# Patient Record
Sex: Male | Born: 1955 | Race: White | Hispanic: No | Marital: Married
Health system: Southern US, Community
[De-identification: ages and names within clinical notes are randomized; demographics above are authoritative.]

---

## 2004-01-27 ENCOUNTER — Other Ambulatory Visit: Payer: Self-pay

## 2005-06-24 ENCOUNTER — Ambulatory Visit: Payer: Self-pay | Admitting: Unknown Physician Specialty

## 2006-01-02 ENCOUNTER — Inpatient Hospital Stay: Payer: Self-pay | Admitting: General Surgery

## 2006-03-07 ENCOUNTER — Ambulatory Visit: Payer: Self-pay | Admitting: Gastroenterology

## 2006-03-19 ENCOUNTER — Other Ambulatory Visit: Payer: Self-pay

## 2006-03-21 ENCOUNTER — Inpatient Hospital Stay: Payer: Self-pay | Admitting: General Surgery

## 2006-11-26 ENCOUNTER — Ambulatory Visit: Payer: Self-pay | Admitting: Unknown Physician Specialty

## 2007-06-04 ENCOUNTER — Ambulatory Visit: Payer: Self-pay | Admitting: Unknown Physician Specialty

## 2007-11-03 ENCOUNTER — Ambulatory Visit: Payer: Self-pay | Admitting: Unknown Physician Specialty

## 2007-11-25 ENCOUNTER — Ambulatory Visit: Payer: Self-pay

## 2007-12-10 ENCOUNTER — Ambulatory Visit: Payer: Self-pay

## 2008-04-15 ENCOUNTER — Ambulatory Visit: Payer: Self-pay | Admitting: Unknown Physician Specialty

## 2008-05-26 ENCOUNTER — Ambulatory Visit: Payer: Self-pay | Admitting: General Surgery

## 2008-05-26 ENCOUNTER — Other Ambulatory Visit: Payer: Self-pay

## 2008-06-02 ENCOUNTER — Ambulatory Visit: Payer: Self-pay | Admitting: General Surgery

## 2013-02-18 ENCOUNTER — Ambulatory Visit: Payer: Self-pay | Admitting: Surgery

## 2013-02-18 DIAGNOSIS — I10 Essential (primary) hypertension: Secondary | ICD-10-CM

## 2013-02-18 LAB — CBC WITH DIFFERENTIAL/PLATELET
Eosinophil #: 0.4 10*3/uL (ref 0.0–0.7)
HCT: 34.6 % — ABNORMAL LOW (ref 40.0–52.0)
Lymphocyte %: 17.5 %
MCH: 34.2 pg — ABNORMAL HIGH (ref 26.0–34.0)
MCHC: 34.7 g/dL (ref 32.0–36.0)
MCV: 99 fL (ref 80–100)
Monocyte #: 0.5 x10 3/mm (ref 0.2–1.0)
Monocyte %: 8.1 %
Neutrophil %: 68 %
Platelet: 74 10*3/uL — ABNORMAL LOW (ref 150–440)
RBC: 3.51 10*6/uL — ABNORMAL LOW (ref 4.40–5.90)
RDW: 13.2 % (ref 11.5–14.5)
WBC: 6.6 10*3/uL (ref 3.8–10.6)

## 2013-02-18 LAB — BASIC METABOLIC PANEL
Anion Gap: 6 — ABNORMAL LOW (ref 7–16)
BUN: 6 mg/dL — ABNORMAL LOW (ref 7–18)
Chloride: 104 mmol/L (ref 98–107)
Creatinine: 1 mg/dL (ref 0.60–1.30)
EGFR (Non-African Amer.): >60
Osmolality: 273 (ref 275–301)
Potassium: 3.4 mmol/L — ABNORMAL LOW (ref 3.5–5.1)

## 2013-03-04 ENCOUNTER — Ambulatory Visit: Payer: Self-pay | Admitting: Surgery

## 2013-03-05 LAB — CBC WITH DIFFERENTIAL/PLATELET
Basophil #: 0 10*3/uL (ref 0.0–0.1)
Basophil %: 0.2 %
Eosinophil %: 0.1 %
HCT: 31.3 % — ABNORMAL LOW (ref 40.0–52.0)
HGB: 11.1 g/dL — ABNORMAL LOW (ref 13.0–18.0)
MCH: 34.9 pg — ABNORMAL HIGH (ref 26.0–34.0)
Monocyte %: 4.8 %
Neutrophil #: 6.5 10*3/uL (ref 1.4–6.5)
Neutrophil %: 85.7 %
Platelet: 54 10*3/uL — ABNORMAL LOW (ref 150–440)
WBC: 7.6 10*3/uL (ref 3.8–10.6)

## 2013-03-05 LAB — HEPATIC FUNCTION PANEL A (ARMC)
Alkaline Phosphatase: 100 U/L (ref 50–136)
Bilirubin, Direct: 2.1 mg/dL — ABNORMAL HIGH (ref 0.00–0.20)
Bilirubin,Total: 4.2 mg/dL — ABNORMAL HIGH (ref 0.2–1.0)
SGOT(AST): 45 U/L — ABNORMAL HIGH (ref 15–37)
SGPT (ALT): 28 U/L (ref 12–78)
Total Protein: 6.2 g/dL — ABNORMAL LOW (ref 6.4–8.2)

## 2013-03-05 LAB — BASIC METABOLIC PANEL
BUN: 23 mg/dL — ABNORMAL HIGH (ref 7–18)
Chloride: 105 mmol/L (ref 98–107)
EGFR (Non-African Amer.): 52 — ABNORMAL LOW
Glucose: 127 mg/dL — ABNORMAL HIGH (ref 65–99)
Osmolality: 279 (ref 275–301)

## 2013-03-10 ENCOUNTER — Emergency Department: Payer: Self-pay

## 2013-03-10 LAB — COMPREHENSIVE METABOLIC PANEL
Albumin: 2.5 g/dL — ABNORMAL LOW (ref 3.4–5.0)
Alkaline Phosphatase: 100 U/L (ref 50–136)
Anion Gap: 6 — ABNORMAL LOW (ref 7–16)
BUN: 15 mg/dL (ref 7–18)
Calcium, Total: 8.7 mg/dL (ref 8.5–10.1)
Chloride: 108 mmol/L — ABNORMAL HIGH (ref 98–107)
Creatinine: 1.05 mg/dL (ref 0.60–1.30)
EGFR (African American): >60
Osmolality: 285 (ref 275–301)
Potassium: 3.2 mmol/L — ABNORMAL LOW (ref 3.5–5.1)
SGOT(AST): 50 U/L — ABNORMAL HIGH (ref 15–37)
SGPT (ALT): 29 U/L (ref 12–78)
Sodium: 142 mmol/L (ref 136–145)
Total Protein: 5.9 g/dL — ABNORMAL LOW (ref 6.4–8.2)

## 2013-03-10 LAB — CBC
HCT: 30.5 % — ABNORMAL LOW (ref 40.0–52.0)
HGB: 10.4 g/dL — ABNORMAL LOW (ref 13.0–18.0)
MCH: 33.5 pg (ref 26.0–34.0)
MCV: 99 fL (ref 80–100)
Platelet: 58 10*3/uL — ABNORMAL LOW (ref 150–440)
RBC: 3.09 10*6/uL — ABNORMAL LOW (ref 4.40–5.90)

## 2013-03-10 LAB — URINALYSIS, COMPLETE
Leukocyte Esterase: NEGATIVE
Nitrite: NEGATIVE
Ph: 6 (ref 4.5–8.0)
Protein: NEGATIVE
Specific Gravity: 1.023 (ref 1.003–1.030)
Squamous Epithelial: 1
WBC UR: 2 /HPF (ref 0–5)

## 2013-03-10 LAB — TROPONIN I: Troponin-I: <0.02 ng/mL

## 2013-03-16 ENCOUNTER — Ambulatory Visit: Payer: Self-pay | Admitting: Surgery

## 2013-03-22 ENCOUNTER — Emergency Department: Payer: Self-pay | Admitting: Emergency Medicine

## 2013-03-22 LAB — BASIC METABOLIC PANEL
Anion Gap: 11 (ref 7–16)
BUN: 13 mg/dL (ref 7–18)
Calcium, Total: 8.9 mg/dL (ref 8.5–10.1)
EGFR (Non-African Amer.): >60
Glucose: 146 mg/dL — ABNORMAL HIGH (ref 65–99)
Potassium: 3.2 mmol/L — ABNORMAL LOW (ref 3.5–5.1)
Sodium: 142 mmol/L (ref 136–145)

## 2013-03-22 LAB — CBC
HCT: 30 % — ABNORMAL LOW (ref 40.0–52.0)
MCH: 34.7 pg — ABNORMAL HIGH (ref 26.0–34.0)
MCHC: 35.5 g/dL (ref 32.0–36.0)
MCV: 98 fL (ref 80–100)
RBC: 3.07 10*6/uL — ABNORMAL LOW (ref 4.40–5.90)
RDW: 14.6 % — ABNORMAL HIGH (ref 11.5–14.5)
WBC: 10.4 10*3/uL (ref 3.8–10.6)

## 2013-03-30 ENCOUNTER — Ambulatory Visit: Payer: Self-pay | Admitting: Family Medicine

## 2013-04-30 ENCOUNTER — Inpatient Hospital Stay: Payer: Self-pay | Admitting: Internal Medicine

## 2013-04-30 LAB — COMPREHENSIVE METABOLIC PANEL
Albumin: 2.3 g/dL — ABNORMAL LOW (ref 3.4–5.0)
Alkaline Phosphatase: 126 U/L (ref 50–136)
Anion Gap: 7 (ref 7–16)
BUN: 13 mg/dL (ref 7–18)
Bilirubin,Total: 3.5 mg/dL — ABNORMAL HIGH (ref 0.2–1.0)
Calcium, Total: 8.5 mg/dL (ref 8.5–10.1)
Chloride: 111 mmol/L — ABNORMAL HIGH (ref 98–107)
Co2: 25 mmol/L (ref 21–32)
Creatinine: 0.98 mg/dL (ref 0.60–1.30)
EGFR (Non-African Amer.): >60
Glucose: 121 mg/dL — ABNORMAL HIGH (ref 65–99)
Osmolality: 286 (ref 275–301)
Potassium: 3.3 mmol/L — ABNORMAL LOW (ref 3.5–5.1)
SGOT(AST): 36 U/L (ref 15–37)
SGPT (ALT): 22 U/L (ref 12–78)
Sodium: 143 mmol/L (ref 136–145)
Total Protein: 5.7 g/dL — ABNORMAL LOW (ref 6.4–8.2)

## 2013-04-30 LAB — PROTIME-INR
INR: 1.6
Prothrombin Time: 19 secs — ABNORMAL HIGH (ref 11.5–14.7)

## 2013-04-30 LAB — CBC
HGB: 9.4 g/dL — ABNORMAL LOW (ref 13.0–18.0)
MCHC: 34.4 g/dL (ref 32.0–36.0)
Platelet: 77 10*3/uL — ABNORMAL LOW (ref 150–440)
WBC: 4.8 10*3/uL (ref 3.8–10.6)

## 2013-04-30 LAB — AMMONIA: Ammonia, Plasma: 122 mcmol/L — ABNORMAL HIGH (ref 11–32)

## 2013-05-01 LAB — URINALYSIS, COMPLETE
Bacteria: NONE SEEN
Blood: NEGATIVE
Glucose,UR: NEGATIVE mg/dL (ref 0–75)
Hyaline Cast: 7
Leukocyte Esterase: NEGATIVE
Nitrite: NEGATIVE
Protein: NEGATIVE
Specific Gravity: 1.013 (ref 1.003–1.030)
Squamous Epithelial: NONE SEEN
WBC UR: 1 /HPF (ref 0–5)

## 2013-05-01 LAB — CBC WITH DIFFERENTIAL/PLATELET
Basophil #: 0 10*3/uL (ref 0.0–0.1)
Basophil %: 1 %
Eosinophil #: 0.2 10*3/uL (ref 0.0–0.7)
Eosinophil %: 4.2 %
Lymphocyte #: 1 10*3/uL (ref 1.0–3.6)
Lymphocyte %: 23.9 %
MCHC: 35.6 g/dL (ref 32.0–36.0)
MCV: 100 fL (ref 80–100)
Monocyte %: 10 %
RBC: 2.58 10*6/uL — ABNORMAL LOW (ref 4.40–5.90)
WBC: 4 10*3/uL (ref 3.8–10.6)

## 2013-05-01 LAB — BASIC METABOLIC PANEL
BUN: 12 mg/dL (ref 7–18)
Chloride: 110 mmol/L — ABNORMAL HIGH (ref 98–107)
Co2: 26 mmol/L (ref 21–32)
EGFR (African American): >60
EGFR (Non-African Amer.): >60
Glucose: 91 mg/dL (ref 65–99)
Osmolality: 282 (ref 275–301)
Sodium: 142 mmol/L (ref 136–145)

## 2013-05-02 LAB — BASIC METABOLIC PANEL
Calcium, Total: 8.7 mg/dL (ref 8.5–10.1)
Chloride: 108 mmol/L — ABNORMAL HIGH (ref 98–107)
EGFR (African American): >60
Glucose: 143 mg/dL — ABNORMAL HIGH (ref 65–99)
Osmolality: 280 (ref 275–301)
Sodium: 139 mmol/L (ref 136–145)

## 2013-06-24 ENCOUNTER — Inpatient Hospital Stay: Payer: Self-pay | Admitting: Internal Medicine

## 2013-06-24 LAB — CBC
HCT: 33.7 % — ABNORMAL LOW (ref 40.0–52.0)
MCH: 32.8 pg (ref 26.0–34.0)
MCV: 95 fL (ref 80–100)
Platelet: 130 10*3/uL — ABNORMAL LOW (ref 150–440)
RBC: 3.56 10*6/uL — ABNORMAL LOW (ref 4.40–5.90)
RDW: 14 % (ref 11.5–14.5)
WBC: 8.3 10*3/uL (ref 3.8–10.6)

## 2013-06-24 LAB — COMPREHENSIVE METABOLIC PANEL
Albumin: 2.9 g/dL — ABNORMAL LOW (ref 3.4–5.0)
Alkaline Phosphatase: 158 U/L — ABNORMAL HIGH (ref 50–136)
Bilirubin,Total: 4.9 mg/dL — ABNORMAL HIGH (ref 0.2–1.0)
Potassium: 4.2 mmol/L (ref 3.5–5.1)
SGOT(AST): 43 U/L — ABNORMAL HIGH (ref 15–37)
Total Protein: 7.6 g/dL (ref 6.4–8.2)

## 2013-06-24 LAB — URINALYSIS, COMPLETE
Bacteria: NONE SEEN
Bilirubin,UR: NEGATIVE
Hyaline Cast: 1
Ketone: NEGATIVE
Leukocyte Esterase: NEGATIVE
Ph: 6 (ref 4.5–8.0)
Protein: NEGATIVE
RBC,UR: 142 /HPF (ref 0–5)
Squamous Epithelial: NONE SEEN
WBC UR: 2 /HPF (ref 0–5)

## 2013-06-24 LAB — PROTIME-INR
INR: 1.4
Prothrombin Time: 17.2 secs — ABNORMAL HIGH (ref 11.5–14.7)

## 2013-06-24 LAB — APTT: Activated PTT: 39.2 secs — ABNORMAL HIGH (ref 23.6–35.9)

## 2013-06-24 LAB — AMMONIA
Ammonia, Plasma: 63 mcmol/L — ABNORMAL HIGH (ref 11–32)
Ammonia, Plasma: 64 mcmol/L — ABNORMAL HIGH (ref 11–32)

## 2013-06-24 LAB — TROPONIN I: Troponin-I: <0.02 ng/mL

## 2013-06-25 LAB — CBC WITH DIFFERENTIAL/PLATELET
Basophil %: 1.3 %
Eosinophil %: 5.5 %
HGB: 11.1 g/dL — ABNORMAL LOW (ref 13.0–18.0)
Lymphocyte %: 28.4 %
MCH: 33.2 pg (ref 26.0–34.0)
MCV: 94 fL (ref 80–100)
Monocyte #: 0.7 x10 3/mm (ref 0.2–1.0)
Monocyte %: 9.8 %
Platelet: 113 10*3/uL — ABNORMAL LOW (ref 150–440)
RDW: 14 % (ref 11.5–14.5)

## 2013-06-25 LAB — MAGNESIUM: Magnesium: 1.5 mg/dL — ABNORMAL LOW

## 2013-06-25 LAB — COMPREHENSIVE METABOLIC PANEL
Albumin: 2.8 g/dL — ABNORMAL LOW (ref 3.4–5.0)
Anion Gap: 7 (ref 7–16)
Bilirubin,Total: 5.1 mg/dL — ABNORMAL HIGH (ref 0.2–1.0)
Calcium, Total: 9.2 mg/dL (ref 8.5–10.1)
Chloride: 106 mmol/L (ref 98–107)
Creatinine: 1 mg/dL (ref 0.60–1.30)
EGFR (African American): >60
EGFR (Non-African Amer.): >60
Osmolality: 274 (ref 275–301)
SGOT(AST): 42 U/L — ABNORMAL HIGH (ref 15–37)
SGPT (ALT): 22 U/L (ref 12–78)
Sodium: 136 mmol/L (ref 136–145)
Total Protein: 6.6 g/dL (ref 6.4–8.2)

## 2013-06-25 LAB — PROTIME-INR: Prothrombin Time: 18.2 secs — ABNORMAL HIGH (ref 11.5–14.7)

## 2013-06-25 LAB — TSH: Thyroid Stimulating Horm: 0.693 u[IU]/mL

## 2013-06-25 LAB — SEDIMENTATION RATE: Erythrocyte Sed Rate: 43 mm/hr — ABNORMAL HIGH (ref 0–20)

## 2013-06-26 LAB — CBC WITH DIFFERENTIAL/PLATELET
Basophil %: 0.9 %
Eosinophil %: 6.2 %
HGB: 10.2 g/dL — ABNORMAL LOW (ref 13.0–18.0)
Lymphocyte #: 1.6 10*3/uL (ref 1.0–3.6)
MCH: 32.5 pg (ref 26.0–34.0)
MCHC: 35 g/dL (ref 32.0–36.0)
MCV: 93 fL (ref 80–100)
Monocyte #: 0.7 x10 3/mm (ref 0.2–1.0)
Neutrophil #: 5 10*3/uL (ref 1.4–6.5)
Neutrophil %: 63.5 %
Platelet: 110 10*3/uL — ABNORMAL LOW (ref 150–440)
RBC: 3.14 10*6/uL — ABNORMAL LOW (ref 4.40–5.90)
WBC: 7.9 10*3/uL (ref 3.8–10.6)

## 2013-06-26 LAB — COMPREHENSIVE METABOLIC PANEL
Albumin: 2.6 g/dL — ABNORMAL LOW (ref 3.4–5.0)
BUN: 16 mg/dL (ref 7–18)
Bilirubin,Total: 4.7 mg/dL — ABNORMAL HIGH (ref 0.2–1.0)
Calcium, Total: 9.1 mg/dL (ref 8.5–10.1)
Co2: 24 mmol/L (ref 21–32)
EGFR (African American): >60
Glucose: 101 mg/dL — ABNORMAL HIGH (ref 65–99)
Osmolality: 271 (ref 275–301)
Potassium: 4.5 mmol/L (ref 3.5–5.1)
SGOT(AST): 42 U/L — ABNORMAL HIGH (ref 15–37)
SGPT (ALT): 24 U/L (ref 12–78)
Sodium: 135 mmol/L — ABNORMAL LOW (ref 136–145)
Total Protein: 6.8 g/dL (ref 6.4–8.2)

## 2013-06-26 LAB — URINE CULTURE

## 2013-06-28 LAB — COMPREHENSIVE METABOLIC PANEL
Albumin: 2.6 g/dL — ABNORMAL LOW (ref 3.4–5.0)
Anion Gap: 7 (ref 7–16)
BUN: 15 mg/dL (ref 7–18)
Bilirubin,Total: 4.5 mg/dL — ABNORMAL HIGH (ref 0.2–1.0)
Calcium, Total: 9.2 mg/dL (ref 8.5–10.1)
Chloride: 105 mmol/L (ref 98–107)
Co2: 21 mmol/L (ref 21–32)
Creatinine: 1 mg/dL (ref 0.60–1.30)
EGFR (Non-African Amer.): >60
Glucose: 122 mg/dL — ABNORMAL HIGH (ref 65–99)
Osmolality: 269 (ref 275–301)
Potassium: 4 mmol/L (ref 3.5–5.1)
SGPT (ALT): 31 U/L (ref 12–78)

## 2013-06-28 LAB — AMMONIA: Ammonia, Plasma: 49 mcmol/L — ABNORMAL HIGH (ref 11–32)

## 2013-07-12 ENCOUNTER — Emergency Department: Payer: Self-pay | Admitting: Emergency Medicine

## 2013-07-12 LAB — COMPREHENSIVE METABOLIC PANEL
Albumin: 2.7 g/dL — ABNORMAL LOW (ref 3.4–5.0)
BUN: 13 mg/dL (ref 7–18)
Bilirubin,Total: 5.6 mg/dL — ABNORMAL HIGH (ref 0.2–1.0)
Calcium, Total: 8.7 mg/dL (ref 8.5–10.1)
EGFR (African American): >60
Glucose: 110 mg/dL — ABNORMAL HIGH (ref 65–99)
SGOT(AST): 45 U/L — ABNORMAL HIGH (ref 15–37)

## 2013-07-12 LAB — CBC
HCT: 30 % — ABNORMAL LOW (ref 40.0–52.0)
HGB: 10.7 g/dL — ABNORMAL LOW (ref 13.0–18.0)
MCH: 34.1 pg — ABNORMAL HIGH (ref 26.0–34.0)
MCHC: 35.8 g/dL (ref 32.0–36.0)
MCV: 95 fL (ref 80–100)
Platelet: 104 10*3/uL — ABNORMAL LOW (ref 150–440)
RBC: 3.15 10*6/uL — ABNORMAL LOW (ref 4.40–5.90)
RDW: 18.2 % — ABNORMAL HIGH (ref 11.5–14.5)
WBC: 10 10*3/uL (ref 3.8–10.6)

## 2013-07-12 LAB — URINALYSIS, COMPLETE
Bilirubin,UR: NEGATIVE
Blood: NEGATIVE
Glucose,UR: NEGATIVE mg/dL (ref 0–75)
Granular Cast: 2
Ketone: NEGATIVE
Leukocyte Esterase: NEGATIVE
Ph: 6 (ref 4.5–8.0)
Protein: NEGATIVE
RBC,UR: 1 /HPF (ref 0–5)
Specific Gravity: 1.013 (ref 1.003–1.030)
Squamous Epithelial: NONE SEEN

## 2013-07-23 LAB — COMPREHENSIVE METABOLIC PANEL
Albumin: 3.1 g/dL — ABNORMAL LOW (ref 3.4–5.0)
Anion Gap: 7 (ref 7–16)
BUN: 26 mg/dL — ABNORMAL HIGH (ref 7–18)
Bilirubin,Total: 3.7 mg/dL — ABNORMAL HIGH (ref 0.2–1.0)
EGFR (African American): >60
Glucose: 115 mg/dL — ABNORMAL HIGH (ref 65–99)
Osmolality: 270 (ref 275–301)
Sodium: 132 mmol/L — ABNORMAL LOW (ref 136–145)
Total Protein: 7.4 g/dL (ref 6.4–8.2)

## 2013-07-23 LAB — TROPONIN I: Troponin-I: <0.02 ng/mL

## 2013-07-23 LAB — URINALYSIS, COMPLETE
Bacteria: NONE SEEN
Bilirubin,UR: NEGATIVE
Blood: NEGATIVE
Glucose,UR: NEGATIVE mg/dL (ref 0–75)
Ketone: NEGATIVE
Nitrite: NEGATIVE
Ph: 6 (ref 4.5–8.0)
Specific Gravity: 1.026 (ref 1.003–1.030)
Squamous Epithelial: NONE SEEN

## 2013-07-23 LAB — CBC WITH DIFFERENTIAL/PLATELET
Basophil %: 0.7 %
Eosinophil #: 0.1 10*3/uL (ref 0.0–0.7)
Eosinophil %: 1.2 %
HCT: 34.1 % — ABNORMAL LOW (ref 40.0–52.0)
Lymphocyte #: 1.4 10*3/uL (ref 1.0–3.6)
Monocyte #: 0.4 x10 3/mm (ref 0.2–1.0)
Neutrophil #: 6.6 10*3/uL — ABNORMAL HIGH (ref 1.4–6.5)
Neutrophil %: 77.3 %
Platelet: 127 10*3/uL — ABNORMAL LOW (ref 150–440)
RBC: 3.54 10*6/uL — ABNORMAL LOW (ref 4.40–5.90)
RDW: 17.4 % — ABNORMAL HIGH (ref 11.5–14.5)
WBC: 8.5 10*3/uL (ref 3.8–10.6)

## 2013-07-23 LAB — LIPASE, BLOOD: Lipase: 458 U/L — ABNORMAL HIGH (ref 73–393)

## 2013-07-23 LAB — AMMONIA: Ammonia, Plasma: 69 mcmol/L — ABNORMAL HIGH (ref 11–32)

## 2013-07-24 ENCOUNTER — Observation Stay: Payer: Self-pay | Admitting: Student

## 2013-10-10 ENCOUNTER — Inpatient Hospital Stay: Payer: Self-pay | Admitting: Internal Medicine

## 2013-10-10 LAB — URINALYSIS, COMPLETE
Glucose,UR: NEGATIVE mg/dL (ref 0–75)
Nitrite: NEGATIVE
Protein: NEGATIVE
Specific Gravity: 1.011 (ref 1.003–1.030)
Squamous Epithelial: <1

## 2013-10-10 LAB — COMPREHENSIVE METABOLIC PANEL
Alkaline Phosphatase: 205 U/L — ABNORMAL HIGH (ref 50–136)
Anion Gap: 5 — ABNORMAL LOW (ref 7–16)
Bilirubin,Total: 3.3 mg/dL — ABNORMAL HIGH (ref 0.2–1.0)
Calcium, Total: 9.2 mg/dL (ref 8.5–10.1)
Chloride: 104 mmol/L (ref 98–107)
Co2: 28 mmol/L (ref 21–32)
EGFR (Non-African Amer.): 59 — ABNORMAL LOW
Osmolality: 277 (ref 275–301)
Potassium: 5.1 mmol/L (ref 3.5–5.1)
Sodium: 137 mmol/L (ref 136–145)

## 2013-10-10 LAB — CBC WITH DIFFERENTIAL/PLATELET
Basophil %: 0.2 %
Eosinophil #: 0.1 10*3/uL (ref 0.0–0.7)
Eosinophil %: 1.9 %
HGB: 11 g/dL — ABNORMAL LOW (ref 13.0–18.0)
Lymphocyte #: 1.5 10*3/uL (ref 1.0–3.6)
Lymphocyte %: 23.9 %
MCV: 93 fL (ref 80–100)
Monocyte #: 0.6 x10 3/mm (ref 0.2–1.0)
Monocyte %: 9.5 %
Neutrophil #: 4 10*3/uL (ref 1.4–6.5)
Platelet: 86 10*3/uL — ABNORMAL LOW (ref 150–440)
RDW: 14.8 % — ABNORMAL HIGH (ref 11.5–14.5)

## 2013-10-10 LAB — PROTIME-INR
INR: 1.7
Prothrombin Time: 19.6 secs — ABNORMAL HIGH (ref 11.5–14.7)

## 2013-10-10 LAB — DRUG SCREEN, URINE
Amphetamines, Ur Screen: NEGATIVE (ref ?–1000)
Barbiturates, Ur Screen: NEGATIVE (ref ?–200)
Benzodiazepine, Ur Scrn: NEGATIVE (ref ?–200)
Cocaine Metabolite,Ur ~~LOC~~: NEGATIVE (ref ?–300)
Methadone, Ur Screen: NEGATIVE (ref ?–300)
Opiate, Ur Screen: NEGATIVE (ref ?–300)

## 2013-10-10 LAB — AMMONIA: Ammonia, Plasma: 162 mcmol/L — ABNORMAL HIGH (ref 11–32)

## 2013-10-10 LAB — CK TOTAL AND CKMB (NOT AT ARMC)
CK, Total: 47 U/L (ref 35–232)
CK-MB: 0.9 ng/mL (ref 0.5–3.6)

## 2013-10-10 LAB — ETHANOL
Ethanol %: <0.003 % (ref 0.000–0.080)
Ethanol: <3 mg/dL

## 2013-10-10 LAB — TROPONIN I: Troponin-I: <0.02 ng/mL

## 2013-10-11 LAB — HEMOGLOBIN A1C: Hemoglobin A1C: 4.7 % (ref 4.2–6.3)

## 2013-10-11 LAB — COMPREHENSIVE METABOLIC PANEL
Anion Gap: 4 — ABNORMAL LOW (ref 7–16)
Bilirubin,Total: 4 mg/dL — ABNORMAL HIGH (ref 0.2–1.0)
Chloride: 109 mmol/L — ABNORMAL HIGH (ref 98–107)
Co2: 26 mmol/L (ref 21–32)
Creatinine: 1.23 mg/dL (ref 0.60–1.30)
EGFR (African American): >60
Glucose: 83 mg/dL (ref 65–99)
Osmolality: 279 (ref 275–301)
SGOT(AST): 56 U/L — ABNORMAL HIGH (ref 15–37)
SGPT (ALT): 40 U/L (ref 12–78)
Sodium: 139 mmol/L (ref 136–145)
Total Protein: 5.2 g/dL — ABNORMAL LOW (ref 6.4–8.2)

## 2013-10-11 LAB — CBC WITH DIFFERENTIAL/PLATELET
Basophil #: 0 10*3/uL (ref 0.0–0.1)
Eosinophil #: 0.1 10*3/uL (ref 0.0–0.7)
Eosinophil %: 1.7 %
HCT: 28.2 % — ABNORMAL LOW (ref 40.0–52.0)
Lymphocyte #: 1.2 10*3/uL (ref 1.0–3.6)
MCH: 32.8 pg (ref 26.0–34.0)
MCHC: 35.2 g/dL (ref 32.0–36.0)
Monocyte #: 0.4 x10 3/mm (ref 0.2–1.0)
Monocyte %: 8.2 %
Platelet: 69 10*3/uL — ABNORMAL LOW (ref 150–440)
RBC: 3.03 10*6/uL — ABNORMAL LOW (ref 4.40–5.90)

## 2013-10-11 LAB — AMMONIA: Ammonia, Plasma: 88 mcmol/L — ABNORMAL HIGH (ref 11–32)

## 2013-10-11 LAB — LIPID PANEL
Cholesterol: 136 mg/dL (ref 0–200)
HDL Cholesterol: 46 mg/dL (ref 40–60)
Ldl Cholesterol, Calc: 80 mg/dL (ref 0–100)
Triglycerides: 48 mg/dL (ref 0–200)
VLDL Cholesterol, Calc: 10 mg/dL (ref 5–40)

## 2013-10-11 LAB — MAGNESIUM: Magnesium: 1.3 mg/dL — ABNORMAL LOW

## 2013-10-12 LAB — HEPATIC FUNCTION PANEL A (ARMC)
Albumin: 2.2 g/dL — ABNORMAL LOW (ref 3.4–5.0)
Alkaline Phosphatase: 196 U/L — ABNORMAL HIGH (ref 50–136)
Bilirubin, Direct: 2.3 mg/dL — ABNORMAL HIGH (ref 0.00–0.20)
Bilirubin,Total: 5.5 mg/dL — ABNORMAL HIGH (ref 0.2–1.0)
SGPT (ALT): 47 U/L (ref 12–78)

## 2013-10-12 LAB — CBC WITH DIFFERENTIAL/PLATELET
Basophil #: 0.1 x10 3/mm 3
Basophil %: 0.5 %
Eosinophil #: 0 x10 3/mm 3
Eosinophil %: 0.3 %
HCT: 32 % — ABNORMAL LOW
HGB: 11.1 g/dL — ABNORMAL LOW
Lymphocyte %: 8.1 %
Lymphs Abs: 0.9 x10 3/mm 3 — ABNORMAL LOW
MCH: 32.9 pg
MCHC: 34.5 g/dL
MCV: 95 fL
Monocyte #: 0.6 "x10 3/mm "
Monocyte %: 5.4 %
Neutrophil #: 9.1 x10 3/mm 3 — ABNORMAL HIGH
Neutrophil %: 85.7 %
Platelet: 77 x10 3/mm 3 — ABNORMAL LOW
RBC: 3.36 x10 6/mm 3 — ABNORMAL LOW
RDW: 15 % — ABNORMAL HIGH
WBC: 10.6 x10 3/mm 3

## 2013-10-12 LAB — MAGNESIUM: Magnesium: 2 mg/dL

## 2013-10-12 LAB — AMMONIA: Ammonia, Plasma: 55 umol/L — ABNORMAL HIGH

## 2013-10-13 LAB — BODY FLUID CELL COUNT WITH DIFFERENTIAL
Basophil: 0 %
Eosinophil: 0 %
Lymphocytes: 39 %
Neutrophils: 16 %
Nucleated Cell Count: 374 /mm3
Other Mononuclear Cells: 45 %

## 2013-10-13 LAB — PROTEIN, BODY FLUID

## 2013-10-13 LAB — AMMONIA: Ammonia, Plasma: 50 mcmol/L — ABNORMAL HIGH (ref 11–32)

## 2013-10-15 LAB — CULTURE, BLOOD (SINGLE)

## 2013-10-17 LAB — BODY FLUID CULTURE

## 2013-11-14 LAB — CBC WITH DIFFERENTIAL/PLATELET
Basophil #: 0.1 10*3/uL (ref 0.0–0.1)
Basophil %: 1.1 %
Eosinophil %: 3.2 %
HCT: 31 % — ABNORMAL LOW (ref 40.0–52.0)
Lymphocyte #: 1.6 10*3/uL (ref 1.0–3.6)
Lymphocyte %: 19.8 %
MCH: 32.9 pg (ref 26.0–34.0)
MCV: 96 fL (ref 80–100)
Monocyte #: 0.8 x10 3/mm (ref 0.2–1.0)
Neutrophil #: 5.5 10*3/uL (ref 1.4–6.5)
Neutrophil %: 66.4 %
Platelet: 117 10*3/uL — ABNORMAL LOW (ref 150–440)
RBC: 3.21 10*6/uL — ABNORMAL LOW (ref 4.40–5.90)
RDW: 16.5 % — ABNORMAL HIGH (ref 11.5–14.5)

## 2013-11-14 LAB — COMPREHENSIVE METABOLIC PANEL
Albumin: 2.1 g/dL — ABNORMAL LOW (ref 3.4–5.0)
Alkaline Phosphatase: 152 U/L — ABNORMAL HIGH
Anion Gap: 7 (ref 7–16)
Bilirubin,Total: 3.2 mg/dL — ABNORMAL HIGH (ref 0.2–1.0)
Co2: 28 mmol/L (ref 21–32)
Creatinine: 1.02 mg/dL (ref 0.60–1.30)
EGFR (African American): >60
EGFR (Non-African Amer.): >60
Osmolality: 274 (ref 275–301)
Potassium: 3 mmol/L — ABNORMAL LOW (ref 3.5–5.1)
SGOT(AST): 45 U/L — ABNORMAL HIGH (ref 15–37)
SGPT (ALT): 33 U/L (ref 12–78)
Total Protein: 5.6 g/dL — ABNORMAL LOW (ref 6.4–8.2)

## 2013-11-14 LAB — LIPASE, BLOOD: Lipase: 237 U/L (ref 73–393)

## 2013-11-15 ENCOUNTER — Observation Stay: Payer: Self-pay | Admitting: Internal Medicine

## 2013-11-15 LAB — PROTIME-INR: Prothrombin Time: 18 secs — ABNORMAL HIGH (ref 11.5–14.7)

## 2013-11-15 LAB — URINALYSIS, COMPLETE
Bacteria: NONE SEEN
Blood: NEGATIVE
Glucose,UR: NEGATIVE mg/dL (ref 0–75)
Ketone: NEGATIVE
Nitrite: NEGATIVE
Protein: NEGATIVE
RBC,UR: 1 /HPF (ref 0–5)
Specific Gravity: 1.02 (ref 1.003–1.030)
Squamous Epithelial: NONE SEEN

## 2013-11-15 LAB — APTT: Activated PTT: 38 secs — ABNORMAL HIGH (ref 23.6–35.9)

## 2013-11-15 LAB — BODY FLUID CELL COUNT WITH DIFFERENTIAL
Basophil: 0 %
Lymphocytes: 24 %
Nucleated Cell Count: 124 /mm3
Other Cells BF: 0 %
Other Mononuclear Cells: 73 %

## 2013-11-15 LAB — AMMONIA: Ammonia, Plasma: 46 mcmol/L — ABNORMAL HIGH (ref 11–32)

## 2013-11-16 LAB — PROTIME-INR
INR: 1.8
Prothrombin Time: 20.4 secs — ABNORMAL HIGH (ref 11.5–14.7)

## 2013-11-16 LAB — BASIC METABOLIC PANEL
Anion Gap: 7 (ref 7–16)
BUN: 14 mg/dL (ref 7–18)
Chloride: 104 mmol/L (ref 98–107)
Co2: 27 mmol/L (ref 21–32)
Creatinine: 0.98 mg/dL (ref 0.60–1.30)
EGFR (African American): >60
EGFR (Non-African Amer.): >60
Glucose: 176 mg/dL — ABNORMAL HIGH (ref 65–99)
Osmolality: 280 (ref 275–301)
Sodium: 138 mmol/L (ref 136–145)

## 2013-11-16 LAB — CBC WITH DIFFERENTIAL/PLATELET
Basophil #: 0.1 10*3/uL (ref 0.0–0.1)
Basophil %: 1.2 %
Eosinophil #: 0.1 10*3/uL (ref 0.0–0.7)
Eosinophil %: 3 %
Lymphocyte #: 1 10*3/uL (ref 1.0–3.6)
Lymphocyte %: 25 %
MCH: 32.8 pg (ref 26.0–34.0)
MCHC: 34.3 g/dL (ref 32.0–36.0)
MCV: 96 fL (ref 80–100)
Neutrophil #: 2.5 10*3/uL (ref 1.4–6.5)
Neutrophil %: 61.1 %
RBC: 2.78 10*6/uL — ABNORMAL LOW (ref 4.40–5.90)
RDW: 16 % — ABNORMAL HIGH (ref 11.5–14.5)
WBC: 4.1 10*3/uL (ref 3.8–10.6)

## 2013-11-16 LAB — HEPATIC FUNCTION PANEL A (ARMC)
Bilirubin, Direct: 1.5 mg/dL — ABNORMAL HIGH (ref 0.00–0.20)
SGPT (ALT): 29 U/L (ref 12–78)
Total Protein: 5.2 g/dL — ABNORMAL LOW (ref 6.4–8.2)

## 2013-12-01 LAB — TROPONIN I: Troponin-I: <0.02 ng/mL

## 2013-12-01 LAB — CBC
HCT: 35 % — ABNORMAL LOW (ref 40.0–52.0)
MCH: 32.1 pg (ref 26.0–34.0)
MCV: 94 fL (ref 80–100)
Platelet: 127 10*3/uL — ABNORMAL LOW (ref 150–440)
RBC: 3.71 10*6/uL — ABNORMAL LOW (ref 4.40–5.90)
RDW: 15.3 % — ABNORMAL HIGH (ref 11.5–14.5)
WBC: 12.9 10*3/uL — ABNORMAL HIGH (ref 3.8–10.6)

## 2013-12-01 LAB — CK TOTAL AND CKMB (NOT AT ARMC): CK-MB: 0.7 ng/mL (ref 0.5–3.6)

## 2013-12-01 LAB — LIPASE, BLOOD: Lipase: 527 U/L — ABNORMAL HIGH (ref 73–393)

## 2013-12-01 LAB — BASIC METABOLIC PANEL
Anion Gap: 7 (ref 7–16)
Calcium, Total: 8.6 mg/dL (ref 8.5–10.1)
Chloride: 90 mmol/L — ABNORMAL LOW (ref 98–107)
Co2: 29 mmol/L (ref 21–32)
Creatinine: 1.32 mg/dL — ABNORMAL HIGH (ref 0.60–1.30)
EGFR (African American): >60
EGFR (Non-African Amer.): 60 — ABNORMAL LOW
Glucose: 237 mg/dL — ABNORMAL HIGH (ref 65–99)
Osmolality: 262 (ref 275–301)
Potassium: 2.9 mmol/L — ABNORMAL LOW (ref 3.5–5.1)
Sodium: 126 mmol/L — ABNORMAL LOW (ref 136–145)

## 2013-12-01 LAB — PROTIME-INR
INR: 1.9
Prothrombin Time: 21.1 secs — ABNORMAL HIGH (ref 11.5–14.7)

## 2013-12-01 LAB — PRO B NATRIURETIC PEPTIDE: B-Type Natriuretic Peptide: 111 pg/mL (ref 0–125)

## 2013-12-02 ENCOUNTER — Inpatient Hospital Stay: Payer: Self-pay | Admitting: Internal Medicine

## 2013-12-02 LAB — AMMONIA: Ammonia, Plasma: 29 mcmol/L (ref 11–32)

## 2013-12-02 LAB — BASIC METABOLIC PANEL
BUN: 16 mg/dL (ref 7–18)
Calcium, Total: 8.5 mg/dL (ref 8.5–10.1)
Co2: 33 mmol/L — ABNORMAL HIGH (ref 21–32)
Creatinine: 1.21 mg/dL (ref 0.60–1.30)
EGFR (Non-African Amer.): >60
Glucose: 231 mg/dL — ABNORMAL HIGH (ref 65–99)
Osmolality: 262 (ref 275–301)
Potassium: 3.6 mmol/L (ref 3.5–5.1)
Sodium: 126 mmol/L — ABNORMAL LOW (ref 136–145)

## 2013-12-02 LAB — HEPATIC FUNCTION PANEL A (ARMC)
Albumin: 2.3 g/dL — ABNORMAL LOW
Alkaline Phosphatase: 162 U/L — ABNORMAL HIGH
Bilirubin, Direct: 2 mg/dL — ABNORMAL HIGH
Bilirubin,Total: 3.6 mg/dL — ABNORMAL HIGH
SGOT(AST): 51 U/L — ABNORMAL HIGH
SGPT (ALT): 39 U/L
Total Protein: 6.1 g/dL — ABNORMAL LOW

## 2013-12-02 LAB — MAGNESIUM
Magnesium: 1 mg/dL — ABNORMAL LOW
Magnesium: 1.2 mg/dL — ABNORMAL LOW

## 2013-12-03 LAB — CBC WITH DIFFERENTIAL/PLATELET
Basophil #: 0.1 10*3/uL (ref 0.0–0.1)
Basophil %: 0.4 %
Eosinophil %: 0.7 %
HCT: 33.7 % — ABNORMAL LOW (ref 40.0–52.0)
HGB: 11.5 g/dL — ABNORMAL LOW (ref 13.0–18.0)
MCH: 31.8 pg (ref 26.0–34.0)
MCHC: 34.1 g/dL (ref 32.0–36.0)
MCV: 93 fL (ref 80–100)
Monocyte #: 1.6 x10 3/mm — ABNORMAL HIGH (ref 0.2–1.0)
Monocyte %: 8.4 %
Neutrophil %: 82.4 %
Platelet: 141 10*3/uL — ABNORMAL LOW (ref 150–440)
WBC: 18.9 10*3/uL — ABNORMAL HIGH (ref 3.8–10.6)

## 2013-12-03 LAB — BASIC METABOLIC PANEL
BUN: 17 mg/dL (ref 7–18)
Chloride: 92 mmol/L — ABNORMAL LOW (ref 98–107)
Co2: 31 mmol/L (ref 21–32)
EGFR (African American): >60
EGFR (Non-African Amer.): >60
Glucose: 114 mg/dL — ABNORMAL HIGH (ref 65–99)
Osmolality: 258 (ref 275–301)
Sodium: 127 mmol/L — ABNORMAL LOW (ref 136–145)

## 2013-12-03 LAB — MAGNESIUM: Magnesium: 1.6 mg/dL — ABNORMAL LOW

## 2013-12-04 LAB — CBC WITH DIFFERENTIAL/PLATELET
Eosinophil #: 0.1 10*3/uL (ref 0.0–0.7)
Eosinophil %: 0.7 %
HGB: 10.3 g/dL — ABNORMAL LOW (ref 13.0–18.0)
Lymphocyte %: 9.4 %
MCH: 31.7 pg (ref 26.0–34.0)
MCHC: 34.3 g/dL (ref 32.0–36.0)
MCV: 93 fL (ref 80–100)
Monocyte #: 1 x10 3/mm (ref 0.2–1.0)
Monocyte %: 7.9 %
Neutrophil %: 81.7 %
RBC: 3.25 10*6/uL — ABNORMAL LOW (ref 4.40–5.90)
WBC: 12.5 10*3/uL — ABNORMAL HIGH (ref 3.8–10.6)

## 2013-12-04 LAB — BASIC METABOLIC PANEL
Anion Gap: 6 — ABNORMAL LOW (ref 7–16)
Calcium, Total: 8 mg/dL — ABNORMAL LOW (ref 8.5–10.1)
Chloride: 92 mmol/L — ABNORMAL LOW (ref 98–107)
Creatinine: 1.1 mg/dL (ref 0.60–1.30)
EGFR (Non-African Amer.): >60
Glucose: 163 mg/dL — ABNORMAL HIGH (ref 65–99)
Osmolality: 260 (ref 275–301)
Sodium: 127 mmol/L — ABNORMAL LOW (ref 136–145)

## 2013-12-07 ENCOUNTER — Ambulatory Visit: Payer: Self-pay | Admitting: Internal Medicine

## 2013-12-11 ENCOUNTER — Inpatient Hospital Stay: Payer: Self-pay | Admitting: Family Medicine

## 2013-12-11 LAB — PROTIME-INR
INR: 1.6
PROTHROMBIN TIME: 18.4 s — AB (ref 11.5–14.7)

## 2013-12-11 LAB — CBC
HCT: 36.6 % — ABNORMAL LOW (ref 40.0–52.0)
HGB: 12.4 g/dL — AB (ref 13.0–18.0)
MCH: 30.8 pg (ref 26.0–34.0)
MCHC: 33.8 g/dL (ref 32.0–36.0)
MCV: 91 fL (ref 80–100)
Platelet: 197 10*3/uL (ref 150–440)
RBC: 4.02 10*6/uL — ABNORMAL LOW (ref 4.40–5.90)
RDW: 15.2 % — AB (ref 11.5–14.5)
WBC: 12.4 10*3/uL — ABNORMAL HIGH (ref 3.8–10.6)

## 2013-12-11 LAB — COMPREHENSIVE METABOLIC PANEL
ALK PHOS: 153 U/L — AB
Albumin: 2 g/dL — ABNORMAL LOW (ref 3.4–5.0)
Anion Gap: 5 — ABNORMAL LOW (ref 7–16)
BUN: 15 mg/dL (ref 7–18)
Bilirubin,Total: 3.5 mg/dL — ABNORMAL HIGH (ref 0.2–1.0)
CALCIUM: 8.5 mg/dL (ref 8.5–10.1)
CO2: 28 mmol/L (ref 21–32)
CREATININE: 0.89 mg/dL (ref 0.60–1.30)
Chloride: 93 mmol/L — ABNORMAL LOW (ref 98–107)
EGFR (African American): >60
Glucose: 141 mg/dL — ABNORMAL HIGH (ref 65–99)
OSMOLALITY: 257 (ref 275–301)
Potassium: 3.4 mmol/L — ABNORMAL LOW (ref 3.5–5.1)
SGOT(AST): 52 U/L — ABNORMAL HIGH (ref 15–37)
SGPT (ALT): 36 U/L (ref 12–78)
Sodium: 126 mmol/L — ABNORMAL LOW (ref 136–145)
Total Protein: 6.2 g/dL — ABNORMAL LOW (ref 6.4–8.2)

## 2013-12-11 LAB — CK TOTAL AND CKMB (NOT AT ARMC)
CK, Total: 42 U/L (ref 35–232)
CK-MB: 0.8 ng/mL (ref 0.5–3.6)

## 2013-12-11 LAB — AMMONIA: AMMONIA, PLASMA: 66 umol/L — AB (ref 11–32)

## 2013-12-11 LAB — TROPONIN I: Troponin-I: <0.02 ng/mL

## 2013-12-13 LAB — CBC WITH DIFFERENTIAL/PLATELET
BASOS ABS: 0.1 10*3/uL (ref 0.0–0.1)
BASOS PCT: 0.4 %
Eosinophil #: 0.1 10*3/uL (ref 0.0–0.7)
Eosinophil %: 0.7 %
HCT: 31.2 % — ABNORMAL LOW (ref 40.0–52.0)
HGB: 10.6 g/dL — AB (ref 13.0–18.0)
LYMPHS ABS: 1.3 10*3/uL (ref 1.0–3.6)
Lymphocyte %: 7.4 %
MCH: 31 pg (ref 26.0–34.0)
MCHC: 34.1 g/dL (ref 32.0–36.0)
MCV: 91 fL (ref 80–100)
Monocyte #: 1.4 x10 3/mm — ABNORMAL HIGH (ref 0.2–1.0)
Monocyte %: 8.5 %
NEUTROS ABS: 14 10*3/uL — AB (ref 1.4–6.5)
NEUTROS PCT: 83 %
Platelet: 142 10*3/uL — ABNORMAL LOW (ref 150–440)
RBC: 3.44 10*6/uL — ABNORMAL LOW (ref 4.40–5.90)
RDW: 14.7 % — AB (ref 11.5–14.5)
WBC: 16.9 10*3/uL — AB (ref 3.8–10.6)

## 2013-12-13 LAB — BASIC METABOLIC PANEL
Anion Gap: 5 — ABNORMAL LOW (ref 7–16)
BUN: 24 mg/dL — ABNORMAL HIGH (ref 7–18)
CALCIUM: 8.3 mg/dL — AB (ref 8.5–10.1)
CREATININE: 1.3 mg/dL (ref 0.60–1.30)
Chloride: 91 mmol/L — ABNORMAL LOW (ref 98–107)
Co2: 28 mmol/L (ref 21–32)
EGFR (African American): >60
EGFR (Non-African Amer.): >60
GLUCOSE: 173 mg/dL — AB (ref 65–99)
Osmolality: 258 (ref 275–301)
Potassium: 3.7 mmol/L (ref 3.5–5.1)
SODIUM: 124 mmol/L — AB (ref 136–145)

## 2013-12-13 LAB — MAGNESIUM: Magnesium: 1.1 mg/dL — ABNORMAL LOW

## 2013-12-14 DIAGNOSIS — R000 Tachycardia, unspecified: Secondary | ICD-10-CM

## 2013-12-14 LAB — CBC WITH DIFFERENTIAL/PLATELET
Basophil #: 0.1 10*3/uL (ref 0.0–0.1)
Basophil %: 0.5 %
EOS PCT: 3.1 %
Eosinophil #: 0.4 10*3/uL (ref 0.0–0.7)
HCT: 31.2 % — ABNORMAL LOW (ref 40.0–52.0)
HGB: 10.7 g/dL — AB (ref 13.0–18.0)
Lymphocyte #: 1.1 10*3/uL (ref 1.0–3.6)
Lymphocyte %: 9.3 %
MCH: 31 pg (ref 26.0–34.0)
MCHC: 34.3 g/dL (ref 32.0–36.0)
MCV: 90 fL (ref 80–100)
Monocyte #: 1.3 x10 3/mm — ABNORMAL HIGH (ref 0.2–1.0)
Monocyte %: 10.6 %
NEUTROS ABS: 9.4 10*3/uL — AB (ref 1.4–6.5)
Neutrophil %: 76.5 %
Platelet: 141 10*3/uL — ABNORMAL LOW (ref 150–440)
RBC: 3.46 10*6/uL — ABNORMAL LOW (ref 4.40–5.90)
RDW: 15.3 % — ABNORMAL HIGH (ref 11.5–14.5)
WBC: 12.3 10*3/uL — AB (ref 3.8–10.6)

## 2013-12-14 LAB — BASIC METABOLIC PANEL
Anion Gap: 3 — ABNORMAL LOW (ref 7–16)
BUN: 21 mg/dL — ABNORMAL HIGH (ref 7–18)
CO2: 30 mmol/L (ref 21–32)
Calcium, Total: 8.8 mg/dL (ref 8.5–10.1)
Chloride: 92 mmol/L — ABNORMAL LOW (ref 98–107)
Creatinine: 0.96 mg/dL (ref 0.60–1.30)
EGFR (Non-African Amer.): >60
Glucose: 115 mg/dL — ABNORMAL HIGH (ref 65–99)
OSMOLALITY: 255 (ref 275–301)
POTASSIUM: 3.8 mmol/L (ref 3.5–5.1)
Sodium: 125 mmol/L — ABNORMAL LOW (ref 136–145)

## 2013-12-14 LAB — AMMONIA: AMMONIA, PLASMA: 25 umol/L (ref 11–32)

## 2013-12-15 LAB — BASIC METABOLIC PANEL
Anion Gap: 6 — ABNORMAL LOW (ref 7–16)
BUN: 17 mg/dL (ref 7–18)
CO2: 29 mmol/L (ref 21–32)
CREATININE: 1.05 mg/dL (ref 0.60–1.30)
Calcium, Total: 8.8 mg/dL (ref 8.5–10.1)
Chloride: 90 mmol/L — ABNORMAL LOW (ref 98–107)
EGFR (African American): >60
EGFR (Non-African Amer.): >60
Glucose: 96 mg/dL (ref 65–99)
OSMOLALITY: 253 (ref 275–301)
Potassium: 4 mmol/L (ref 3.5–5.1)
SODIUM: 125 mmol/L — AB (ref 136–145)

## 2013-12-15 LAB — PROTIME-INR
INR: 1.5
Prothrombin Time: 17.8 secs — ABNORMAL HIGH (ref 11.5–14.7)

## 2013-12-15 LAB — PLATELET COUNT: PLATELETS: 123 10*3/uL — AB (ref 150–440)

## 2013-12-16 LAB — BASIC METABOLIC PANEL
ANION GAP: 6 — AB (ref 7–16)
BUN: 17 mg/dL (ref 7–18)
Calcium, Total: 8.8 mg/dL (ref 8.5–10.1)
Chloride: 87 mmol/L — ABNORMAL LOW (ref 98–107)
Co2: 31 mmol/L (ref 21–32)
Creatinine: 1 mg/dL (ref 0.60–1.30)
EGFR (Non-African Amer.): >60
Glucose: 112 mg/dL — ABNORMAL HIGH (ref 65–99)
Osmolality: 252 (ref 275–301)
Potassium: 3.5 mmol/L (ref 3.5–5.1)
SODIUM: 124 mmol/L — AB (ref 136–145)

## 2014-01-05 ENCOUNTER — Ambulatory Visit: Payer: Self-pay | Admitting: Gastroenterology

## 2014-01-05 LAB — CBC WITH DIFFERENTIAL/PLATELET
Basophil #: 0 10*3/uL (ref 0.0–0.1)
Basophil %: 0.1 %
Eosinophil #: 0.1 10*3/uL (ref 0.0–0.7)
Eosinophil %: 0.5 %
HCT: 35.4 % — ABNORMAL LOW (ref 40.0–52.0)
HGB: 11.8 g/dL — ABNORMAL LOW (ref 13.0–18.0)
Lymphocyte #: 0.9 10*3/uL — ABNORMAL LOW (ref 1.0–3.6)
Lymphocyte %: 7.4 %
MCH: 30.4 pg (ref 26.0–34.0)
MCHC: 33.3 g/dL (ref 32.0–36.0)
MCV: 92 fL (ref 80–100)
MONO ABS: 0.9 x10 3/mm (ref 0.2–1.0)
Monocyte %: 7.8 %
NEUTROS ABS: 10.1 10*3/uL — AB (ref 1.4–6.5)
NEUTROS PCT: 84.2 %
Platelet: 95 10*3/uL — ABNORMAL LOW (ref 150–440)
RBC: 3.87 10*6/uL — ABNORMAL LOW (ref 4.40–5.90)
RDW: 16.9 % — AB (ref 11.5–14.5)
WBC: 12 10*3/uL — ABNORMAL HIGH (ref 3.8–10.6)

## 2014-01-05 LAB — PROTIME-INR
INR: 1.7
PROTHROMBIN TIME: 19.6 s — AB (ref 11.5–14.7)

## 2014-01-05 LAB — APTT: Activated PTT: 35.4 secs (ref 23.6–35.9)

## 2014-01-09 ENCOUNTER — Ambulatory Visit: Payer: Self-pay | Admitting: Internal Medicine

## 2014-01-09 LAB — BODY FLUID CULTURE

## 2014-01-10 ENCOUNTER — Observation Stay: Payer: Self-pay | Admitting: Family Medicine

## 2014-01-10 LAB — CBC WITH DIFFERENTIAL/PLATELET
BASOS PCT: 1 %
Basophil #: 0.1 10*3/uL (ref 0.0–0.1)
EOS ABS: 0 10*3/uL (ref 0.0–0.7)
EOS PCT: 0.4 %
HCT: 33.6 % — ABNORMAL LOW (ref 40.0–52.0)
HGB: 11.6 g/dL — AB (ref 13.0–18.0)
Lymphocyte #: 1.2 10*3/uL (ref 1.0–3.6)
Lymphocyte %: 11.5 %
MCH: 31.6 pg (ref 26.0–34.0)
MCHC: 34.6 g/dL (ref 32.0–36.0)
MCV: 92 fL (ref 80–100)
Monocyte #: 1 x10 3/mm (ref 0.2–1.0)
Monocyte %: 9.1 %
Neutrophil #: 8.3 10*3/uL — ABNORMAL HIGH (ref 1.4–6.5)
Neutrophil %: 78 %
Platelet: 139 10*3/uL — ABNORMAL LOW (ref 150–440)
RBC: 3.68 10*6/uL — ABNORMAL LOW (ref 4.40–5.90)
RDW: 18.1 % — AB (ref 11.5–14.5)
WBC: 10.7 10*3/uL — ABNORMAL HIGH (ref 3.8–10.6)

## 2014-01-10 LAB — COMPREHENSIVE METABOLIC PANEL
Albumin: 2.5 g/dL — ABNORMAL LOW (ref 3.4–5.0)
Alkaline Phosphatase: 171 U/L — ABNORMAL HIGH
Anion Gap: 6 — ABNORMAL LOW (ref 7–16)
BUN: 22 mg/dL — ABNORMAL HIGH (ref 7–18)
Bilirubin,Total: 5.7 mg/dL — ABNORMAL HIGH (ref 0.2–1.0)
CO2: 29 mmol/L (ref 21–32)
Calcium, Total: 9.7 mg/dL (ref 8.5–10.1)
Chloride: 87 mmol/L — ABNORMAL LOW (ref 98–107)
Creatinine: 1.24 mg/dL (ref 0.60–1.30)
EGFR (African American): >60
Glucose: 121 mg/dL — ABNORMAL HIGH (ref 65–99)
Osmolality: 250 (ref 275–301)
Potassium: 4.9 mmol/L (ref 3.5–5.1)
SGOT(AST): 42 U/L — ABNORMAL HIGH (ref 15–37)
SGPT (ALT): 30 U/L (ref 12–78)
Sodium: 122 mmol/L — ABNORMAL LOW (ref 136–145)
TOTAL PROTEIN: 6.5 g/dL (ref 6.4–8.2)

## 2014-01-10 LAB — URINALYSIS, COMPLETE
BILIRUBIN, UR: NEGATIVE
Bacteria: NONE SEEN
Blood: NEGATIVE
Glucose,UR: NEGATIVE mg/dL (ref 0–75)
Ketone: NEGATIVE
LEUKOCYTE ESTERASE: NEGATIVE
Nitrite: NEGATIVE
PROTEIN: NEGATIVE
Ph: 5 (ref 4.5–8.0)
RBC,UR: 1 /HPF (ref 0–5)
SPECIFIC GRAVITY: 1.024 (ref 1.003–1.030)

## 2014-01-10 LAB — LIPASE, BLOOD: Lipase: 440 U/L — ABNORMAL HIGH (ref 73–393)

## 2014-01-10 LAB — AMMONIA: AMMONIA, PLASMA: 39 umol/L — AB (ref 11–32)

## 2014-01-10 LAB — PROTIME-INR
INR: 1.4
PROTHROMBIN TIME: 17.1 s — AB (ref 11.5–14.7)

## 2014-01-11 LAB — BASIC METABOLIC PANEL
Anion Gap: 8 (ref 7–16)
BUN: 18 mg/dL (ref 7–18)
CALCIUM: 9.5 mg/dL (ref 8.5–10.1)
CO2: 28 mmol/L (ref 21–32)
Chloride: 91 mmol/L — ABNORMAL LOW (ref 98–107)
Creatinine: 0.86 mg/dL (ref 0.60–1.30)
EGFR (African American): >60
Glucose: 92 mg/dL (ref 65–99)
Osmolality: 257 (ref 275–301)
POTASSIUM: 4.6 mmol/L (ref 3.5–5.1)
Sodium: 127 mmol/L — ABNORMAL LOW (ref 136–145)

## 2014-01-11 LAB — CBC WITH DIFFERENTIAL/PLATELET
Basophil #: 0 10*3/uL (ref 0.0–0.1)
Basophil %: 0.4 %
Eosinophil #: 0.2 10*3/uL (ref 0.0–0.7)
Eosinophil %: 1.8 %
HCT: 30.7 % — AB (ref 40.0–52.0)
HGB: 10.5 g/dL — ABNORMAL LOW (ref 13.0–18.0)
LYMPHS PCT: 14.2 %
Lymphocyte #: 1.4 10*3/uL (ref 1.0–3.6)
MCH: 31.4 pg (ref 26.0–34.0)
MCHC: 34.3 g/dL (ref 32.0–36.0)
MCV: 92 fL (ref 80–100)
MONO ABS: 1.1 x10 3/mm — AB (ref 0.2–1.0)
MONOS PCT: 11.1 %
NEUTROS ABS: 7.1 10*3/uL — AB (ref 1.4–6.5)
Neutrophil %: 72.5 %
PLATELETS: 119 10*3/uL — AB (ref 150–440)
RBC: 3.35 10*6/uL — AB (ref 4.40–5.90)
RDW: 18.8 % — ABNORMAL HIGH (ref 11.5–14.5)
WBC: 9.7 10*3/uL (ref 3.8–10.6)

## 2014-01-21 ENCOUNTER — Ambulatory Visit: Payer: Self-pay | Admitting: Gastroenterology

## 2014-01-24 ENCOUNTER — Ambulatory Visit: Payer: Self-pay | Admitting: Gastroenterology

## 2014-01-24 LAB — HEPATIC FUNCTION PANEL A (ARMC)
Albumin: 2.3 g/dL — ABNORMAL LOW (ref 3.4–5.0)
Alkaline Phosphatase: 217 U/L — ABNORMAL HIGH
BILIRUBIN DIRECT: 3.1 mg/dL — AB (ref 0.00–0.20)
BILIRUBIN TOTAL: 6.1 mg/dL — AB (ref 0.2–1.0)
SGOT(AST): 54 U/L — ABNORMAL HIGH (ref 15–37)
SGPT (ALT): 39 U/L (ref 12–78)
TOTAL PROTEIN: 6.6 g/dL (ref 6.4–8.2)

## 2014-01-24 LAB — AMMONIA: Ammonia, Plasma: 87 mcmol/L — ABNORMAL HIGH (ref 11–32)

## 2014-01-24 LAB — PROTIME-INR
INR: 1.8
PROTHROMBIN TIME: 20.4 s — AB (ref 11.5–14.7)

## 2014-01-24 LAB — ETHANOL
Ethanol %: <0.003 % (ref 0.000–0.080)
Ethanol: <3 mg/dL

## 2014-01-24 LAB — APTT: ACTIVATED PTT: 33.7 s (ref 23.6–35.9)

## 2014-01-28 LAB — BODY FLUID CULTURE

## 2014-01-31 ENCOUNTER — Ambulatory Visit: Payer: Self-pay | Admitting: Gastroenterology

## 2014-01-31 LAB — AMMONIA: Ammonia, Plasma: <10 mcmol/L (ref 11–32)

## 2014-01-31 LAB — CBC WITH DIFFERENTIAL/PLATELET
BASOS PCT: 0.5 %
Basophil #: 0 10*3/uL (ref 0.0–0.1)
EOS ABS: 0.1 10*3/uL (ref 0.0–0.7)
EOS PCT: 0.7 %
HCT: 38.8 % — ABNORMAL LOW (ref 40.0–52.0)
HGB: 12.7 g/dL — AB (ref 13.0–18.0)
LYMPHS ABS: 1.2 10*3/uL (ref 1.0–3.6)
Lymphocyte %: 13.6 %
MCH: 30.6 pg (ref 26.0–34.0)
MCHC: 32.7 g/dL (ref 32.0–36.0)
MCV: 94 fL (ref 80–100)
MONOS PCT: 11.9 %
Monocyte #: 1.1 x10 3/mm — ABNORMAL HIGH (ref 0.2–1.0)
NEUTROS PCT: 73.3 %
Neutrophil #: 6.6 10*3/uL — ABNORMAL HIGH (ref 1.4–6.5)
Platelet: 175 10*3/uL (ref 150–440)
RBC: 4.15 10*6/uL — ABNORMAL LOW (ref 4.40–5.90)
RDW: 18.5 % — AB (ref 11.5–14.5)
WBC: 9 10*3/uL (ref 3.8–10.6)

## 2014-01-31 LAB — BASIC METABOLIC PANEL
Anion Gap: 8 (ref 7–16)
BUN: 21 mg/dL — AB (ref 7–18)
CHLORIDE: 83 mmol/L — AB (ref 98–107)
Calcium, Total: 10.1 mg/dL (ref 8.5–10.1)
Co2: 29 mmol/L (ref 21–32)
Creatinine: 1.17 mg/dL (ref 0.60–1.30)
Glucose: 90 mg/dL (ref 65–99)
OSMOLALITY: 245 (ref 275–301)
Potassium: 4.5 mmol/L (ref 3.5–5.1)
Sodium: 120 mmol/L — CL (ref 136–145)

## 2014-01-31 LAB — HEPATIC FUNCTION PANEL A (ARMC)
ALBUMIN: 2.4 g/dL — AB (ref 3.4–5.0)
ALT: 37 U/L (ref 12–78)
Alkaline Phosphatase: 287 U/L — ABNORMAL HIGH
BILIRUBIN DIRECT: 4.2 mg/dL — AB (ref 0.00–0.20)
Bilirubin,Total: 7.3 mg/dL — ABNORMAL HIGH (ref 0.2–1.0)
SGOT(AST): 60 U/L — ABNORMAL HIGH (ref 15–37)
Total Protein: 6.6 g/dL (ref 6.4–8.2)

## 2014-01-31 LAB — PROTIME-INR
INR: 1.6
PROTHROMBIN TIME: 18.8 s — AB (ref 11.5–14.7)

## 2014-01-31 LAB — APTT: ACTIVATED PTT: 36 s — AB (ref 23.6–35.9)

## 2014-02-03 ENCOUNTER — Emergency Department: Payer: Self-pay | Admitting: Internal Medicine

## 2014-02-03 LAB — COMPREHENSIVE METABOLIC PANEL
ANION GAP: 8 (ref 7–16)
Albumin: 2 g/dL — ABNORMAL LOW (ref 3.4–5.0)
Alkaline Phosphatase: 258 U/L — ABNORMAL HIGH
BILIRUBIN TOTAL: 6.3 mg/dL — AB (ref 0.2–1.0)
BUN: 31 mg/dL — ABNORMAL HIGH (ref 7–18)
CALCIUM: 9.3 mg/dL (ref 8.5–10.1)
CHLORIDE: 82 mmol/L — AB (ref 98–107)
CO2: 26 mmol/L (ref 21–32)
Creatinine: 1.73 mg/dL — ABNORMAL HIGH (ref 0.60–1.30)
EGFR (Non-African Amer.): 43 — ABNORMAL LOW
GFR CALC AF AMER: 50 — AB
Glucose: 83 mg/dL (ref 65–99)
OSMOLALITY: 240 (ref 275–301)
POTASSIUM: 5.2 mmol/L — AB (ref 3.5–5.1)
SGOT(AST): 56 U/L — ABNORMAL HIGH (ref 15–37)
SGPT (ALT): 38 U/L (ref 12–78)
Sodium: 116 mmol/L — CL (ref 136–145)
TOTAL PROTEIN: 5.7 g/dL — AB (ref 6.4–8.2)

## 2014-02-03 LAB — CBC
HCT: 33.1 % — ABNORMAL LOW (ref 40.0–52.0)
HGB: 11.5 g/dL — ABNORMAL LOW (ref 13.0–18.0)
MCH: 32.3 pg (ref 26.0–34.0)
MCHC: 34.7 g/dL (ref 32.0–36.0)
MCV: 93 fL (ref 80–100)
Platelet: 137 10*3/uL — ABNORMAL LOW (ref 150–440)
RBC: 3.55 10*6/uL — ABNORMAL LOW (ref 4.40–5.90)
RDW: 18.6 % — ABNORMAL HIGH (ref 11.5–14.5)
WBC: 10.8 10*3/uL — AB (ref 3.8–10.6)

## 2014-02-03 LAB — PROTIME-INR
INR: 1.8
PROTHROMBIN TIME: 20.9 s — AB (ref 11.5–14.7)

## 2014-02-03 LAB — TROPONIN I

## 2014-02-03 LAB — AMMONIA: AMMONIA, PLASMA: 142 umol/L — AB (ref 11–32)

## 2014-02-03 LAB — LIPASE, BLOOD: Lipase: 257 U/L (ref 73–393)

## 2014-02-04 LAB — BODY FLUID CULTURE

## 2014-02-06 DEATH — deceased

## 2014-03-09 ENCOUNTER — Ambulatory Visit: Payer: Self-pay | Admitting: Internal Medicine

## 2014-03-09 DEATH — deceased

## 2014-04-13 IMAGING — CR DG ABDOMEN 3V
1 series · 4 of 4 positions shown · non-contrast
Comparison: 10/10/2013

CLINICAL DATA: Distended abdomen

EXAM:
ACUTE ABDOMEN SERIES (2 VIEW ABDOMEN AND 1 VIEW CHEST)

[Series 1: w chest pa · 0.14mm/px · 4 of 4 slices shown]
[im 1/4]
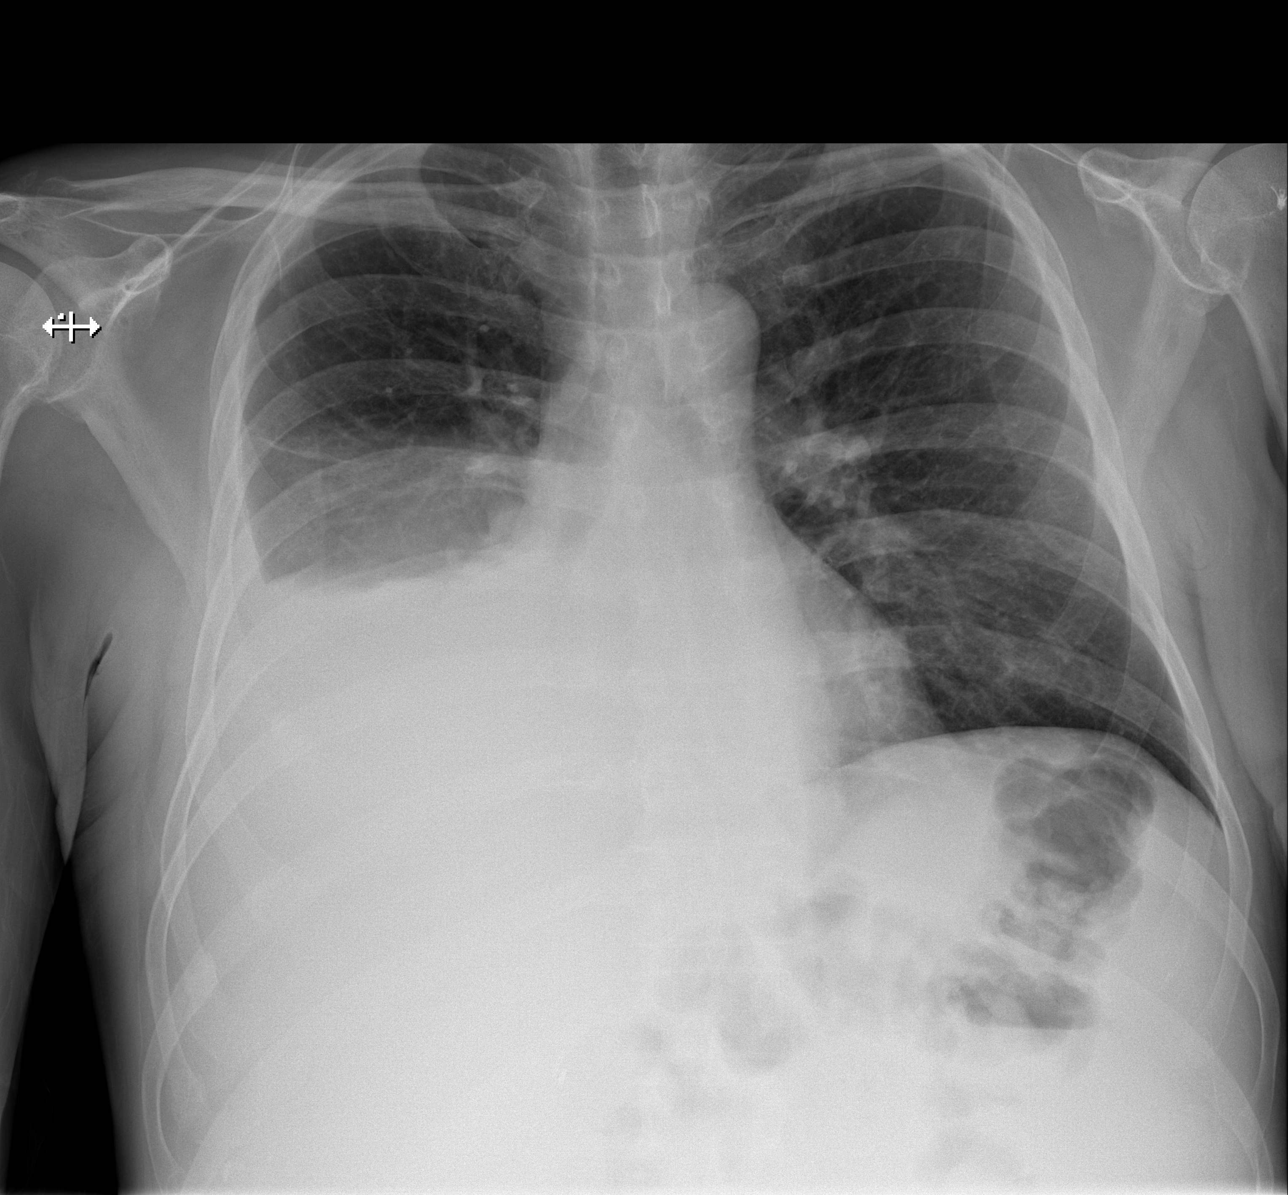
[im 2/4]
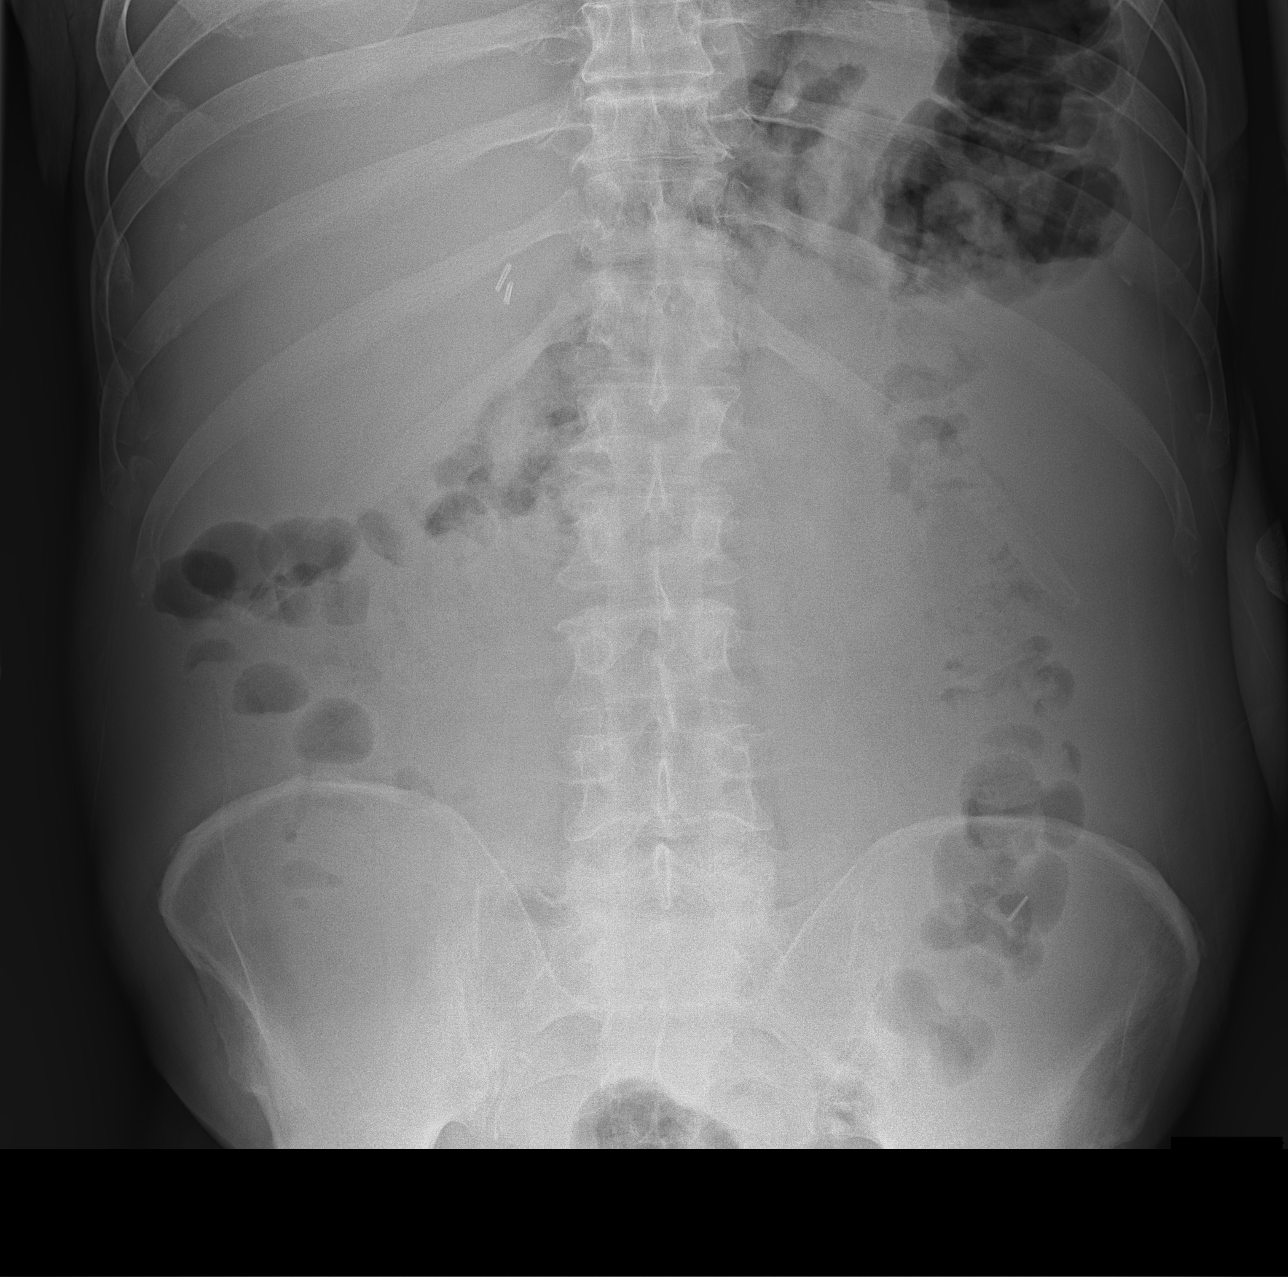
[im 3/4]
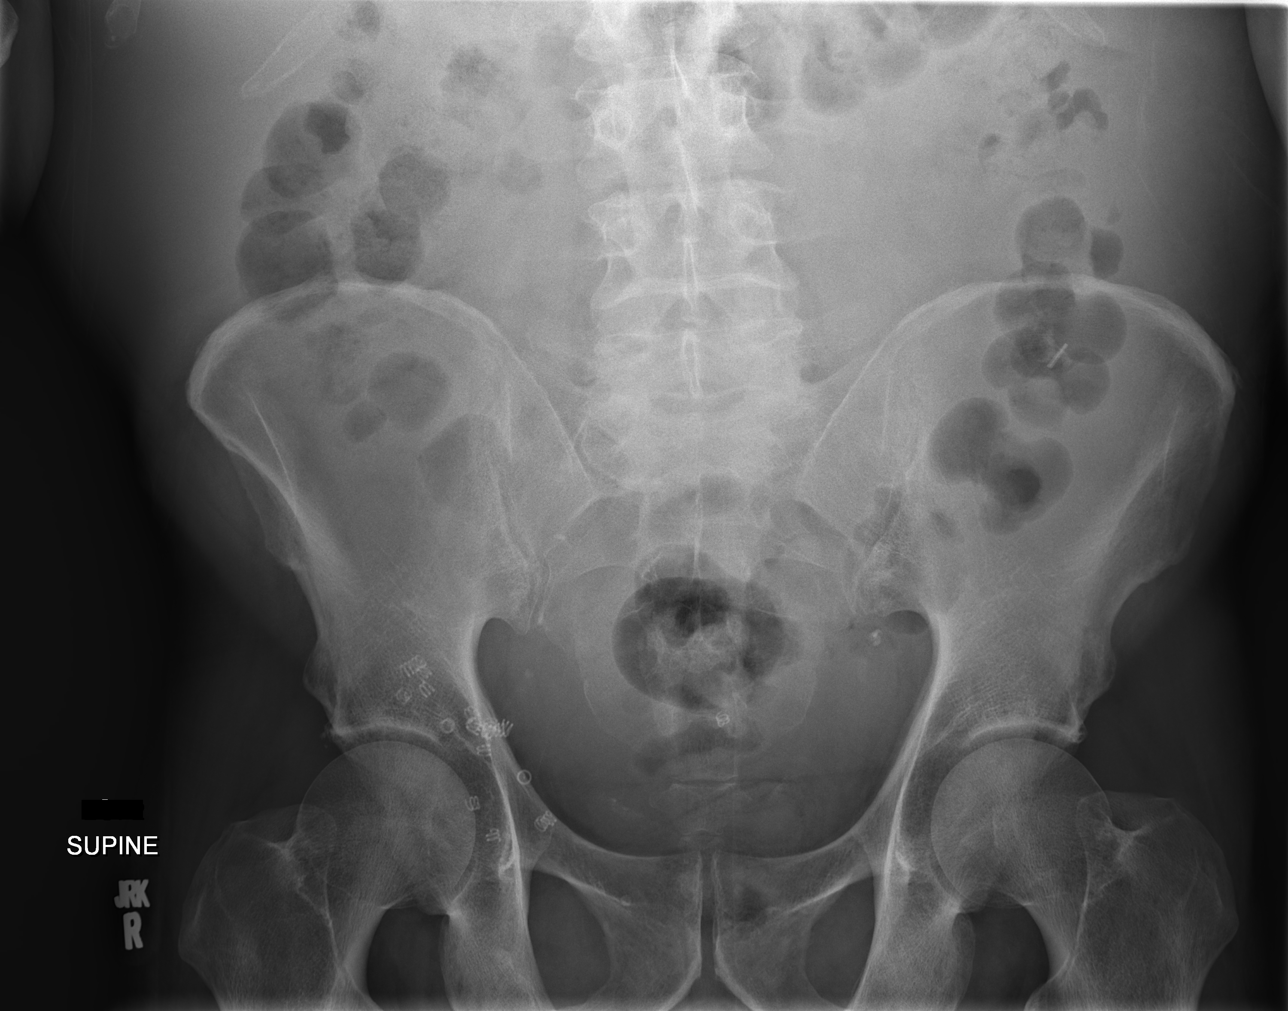
[im 4/4]
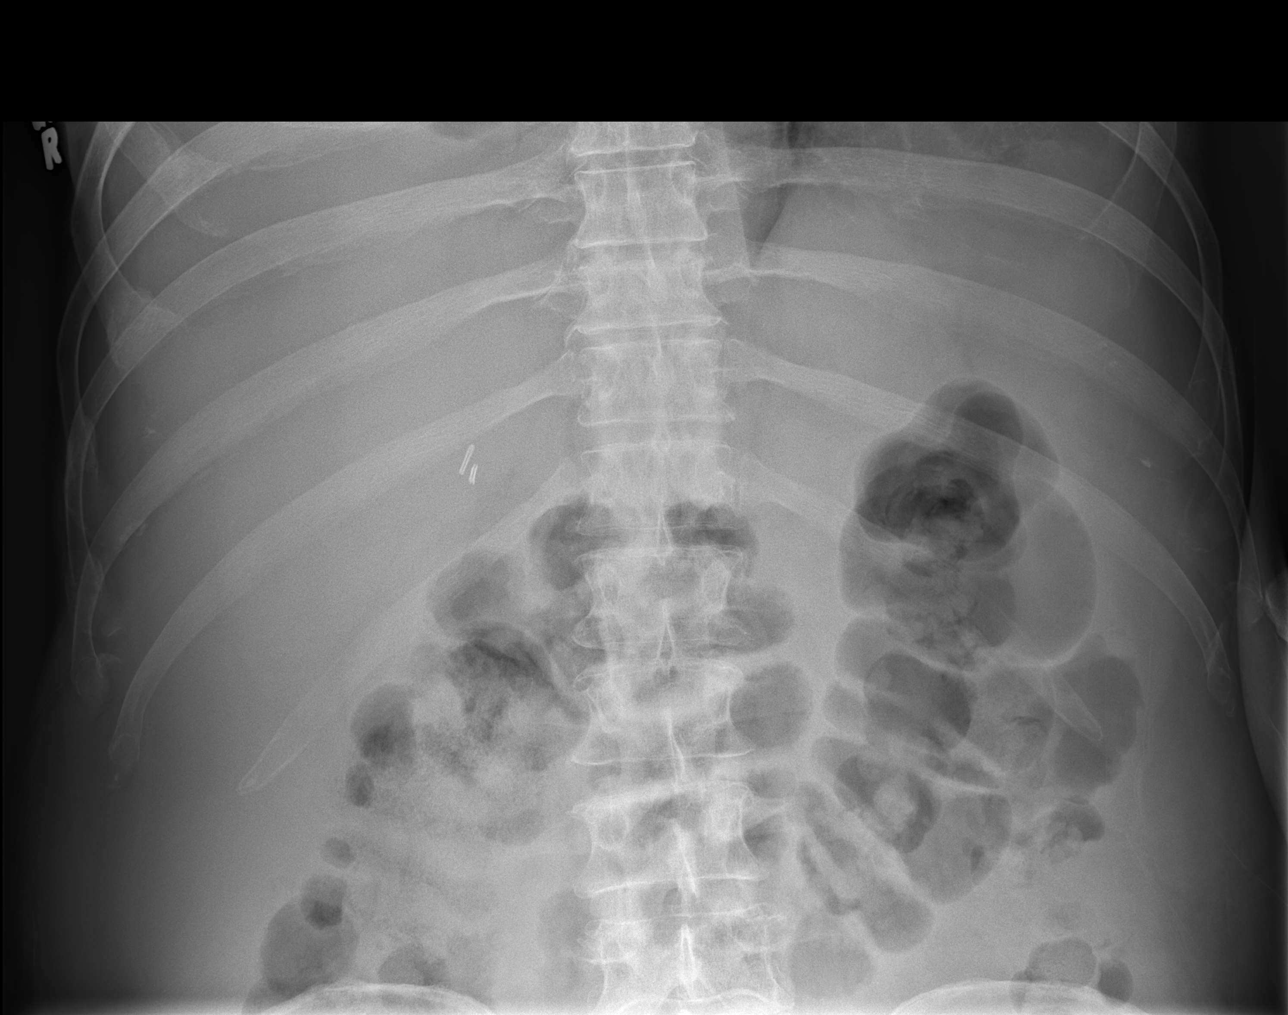

[4 of 4 positions shown; findings below may reference images not displayed]

FINDINGS: Normal heart size. The stress set there are cholecystectomy clips
within the right upper quadrant of the abdomen. Bowel gas pattern is
nonobstructed. No dilated loop of small bowel or air-fluid levels.

Heart size is normal. There is a moderate to large right pleural
effusion. Left lung is clear.
IMPRESSION: 1. Nonobstructive bowel gas pattern.
2. Moderate to large right pleural effusion.

## 2014-04-14 IMAGING — US US GUIDE NEEDLE - US PARA
1 series · 14 of 16 positions shown · non-contrast
Comparison: Prior abdominal ultrasound 05/01/2013

CLINICAL DATA: Cirrhosis, symptomatic large volume ascites

EXAM:
ULTRASOUND GUIDED PARACENTESIS

[Series 1: us guide needle - us para · 0.27mm/px · 14 of 16 slices shown]
[im 1/16]
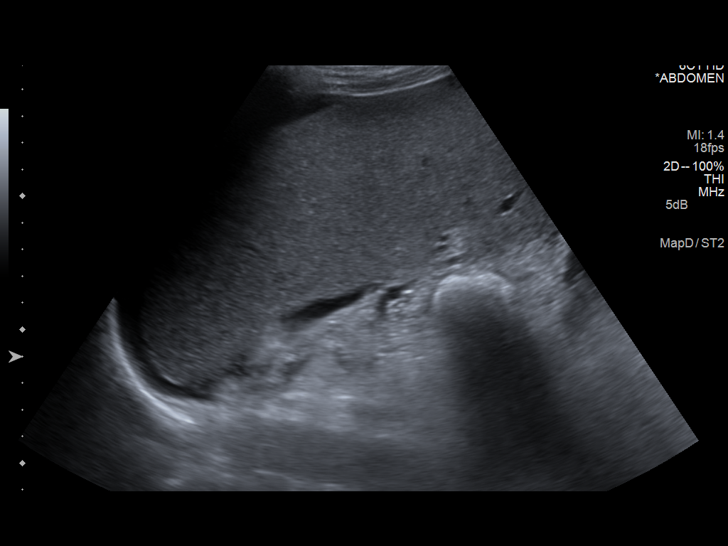
[im 2/16]
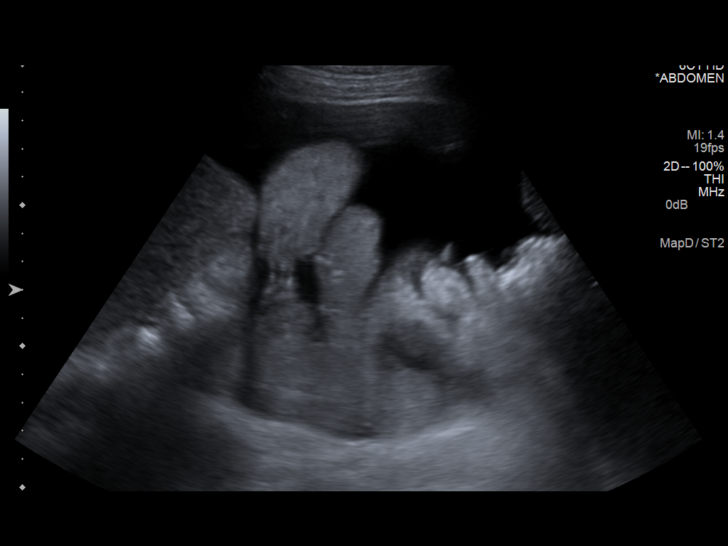
[im 3/16]
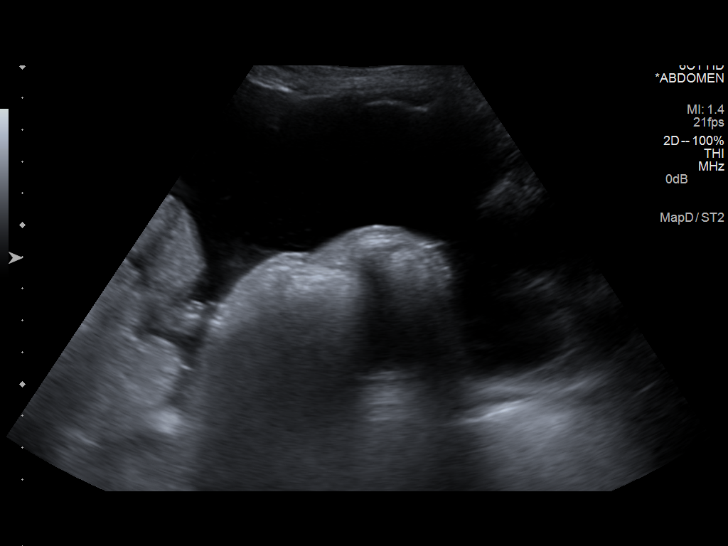
[im 5/16]
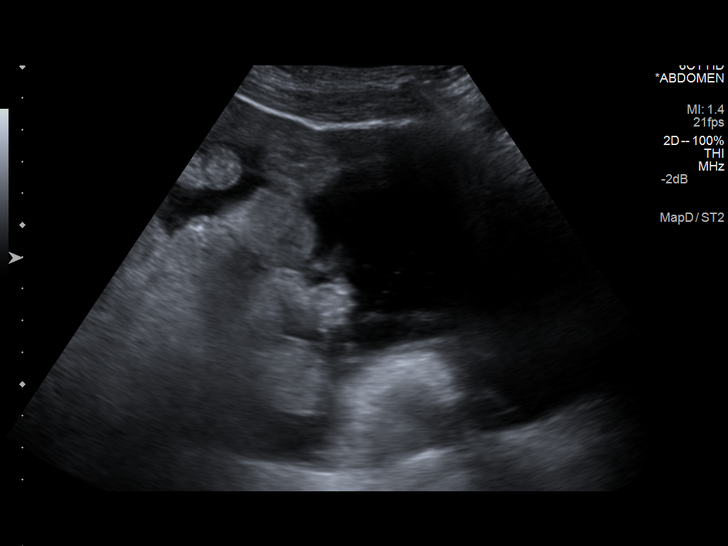
[im 6/16]
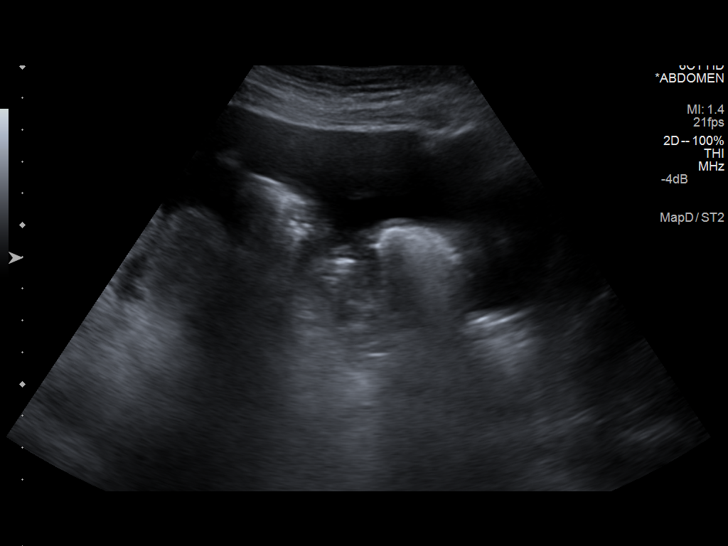
[im 7/16]
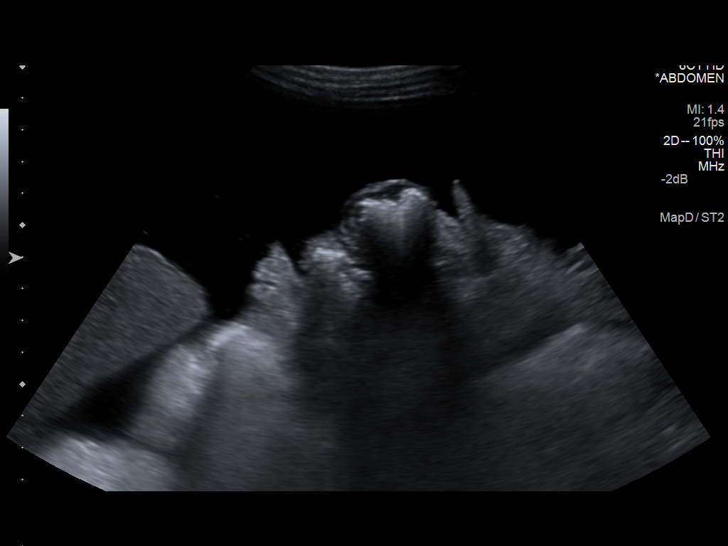
[im 8/16]
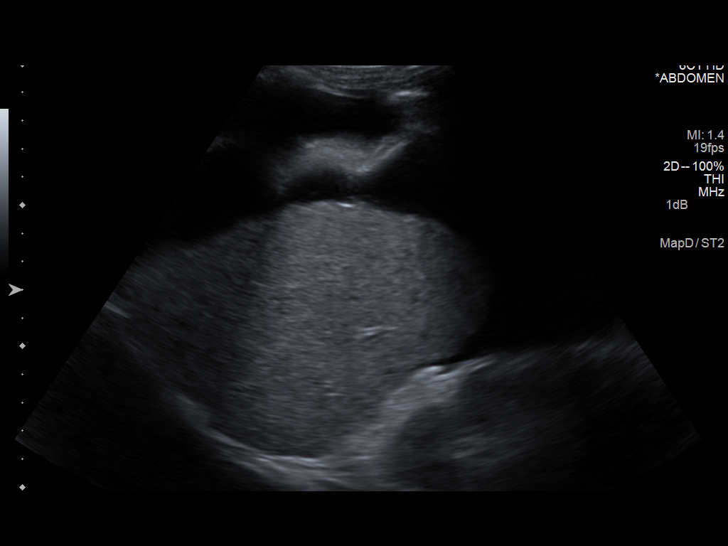
[im 9/16]
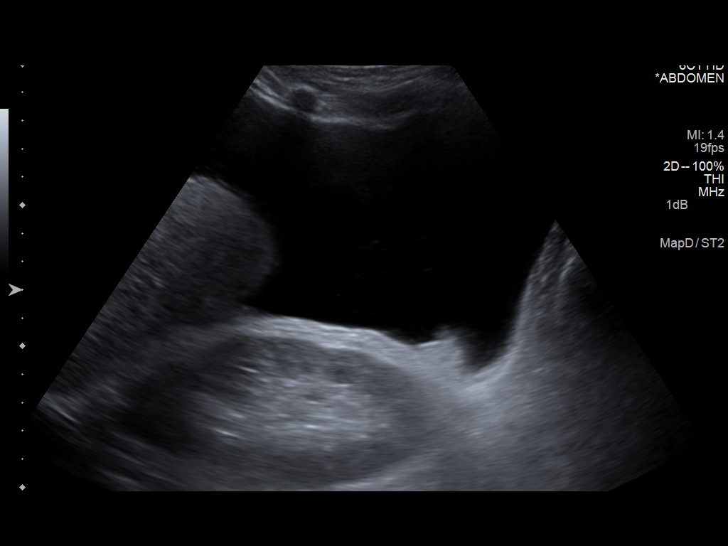
[im 10/16]
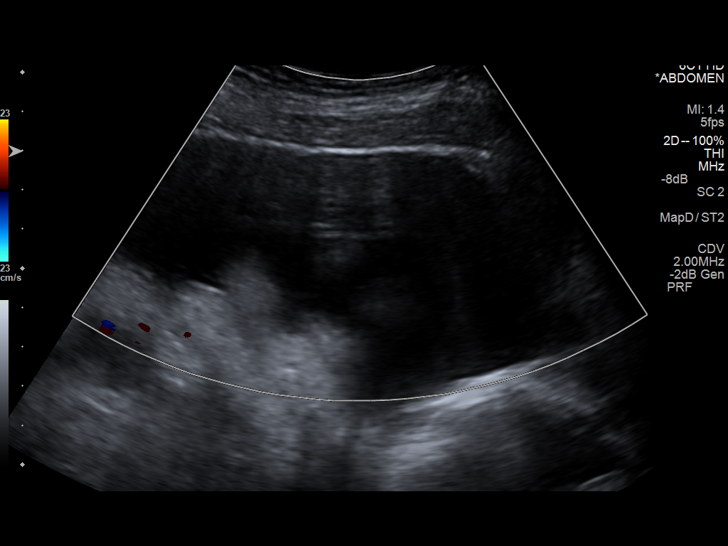
[im 11/16]
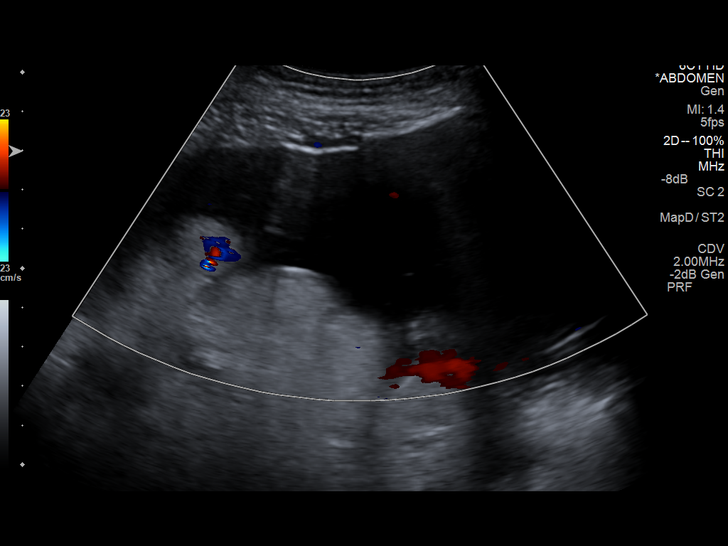
[im 13/16]
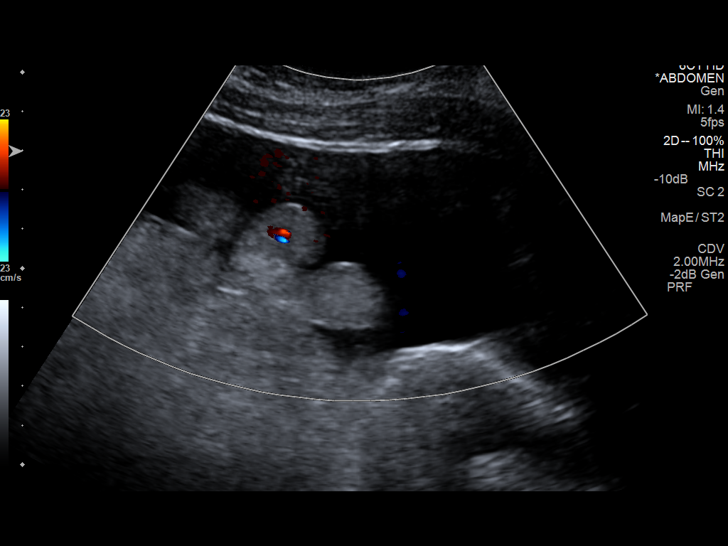
[im 14/16]
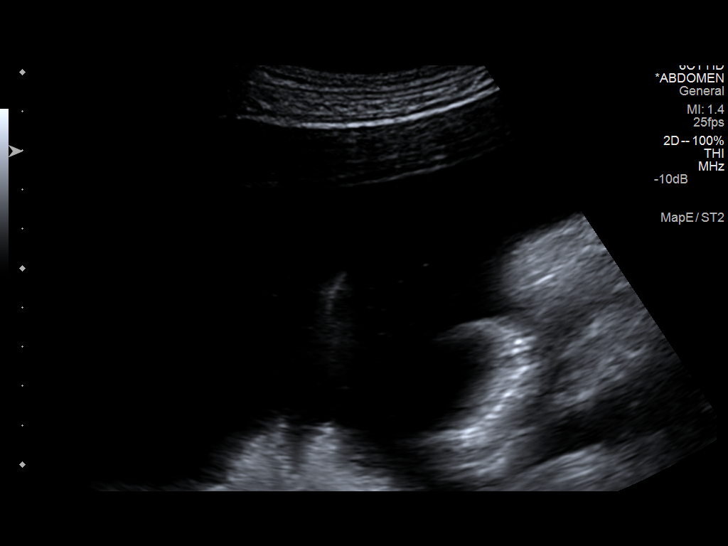
[im 15/16]
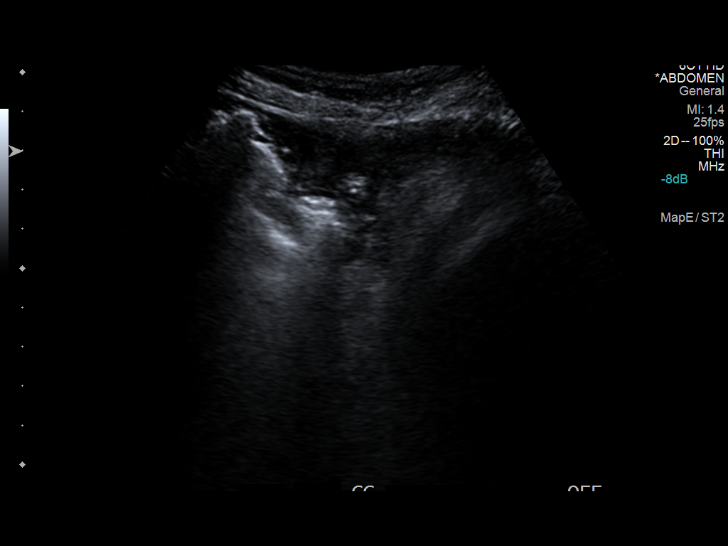
[im 16/16]
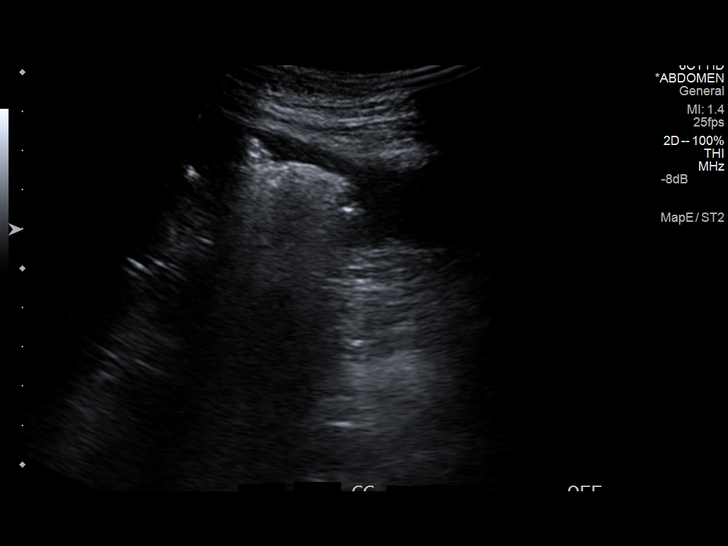

[14 of 16 positions shown; findings below may reference images not displayed]

PROCEDURE:
An ultrasound guided paracentesis was thoroughly discussed with the
patient and questions answered. The benefits, risks, alternatives
and complications were also discussed. The patient understands and
wishes to proceed with the procedure. Written consent was obtained.

Ultrasound was performed to localize and mark an adequate pocket of
fluid in the right quadrant of the abdomen. The area was then
prepped and draped in the normal sterile fashion. 1% Lidocaine was
used for local anesthesia. Under ultrasound guidance a
Safe-T-Centesis catheter was introduced. Paracentesis was performed.
The catheter was removed and a dressing applied.

Complications: None.
FINDINGS: A total of approximately 9199 mL of turbid yellow ascitic fluid was
removed. A fluid sample was sent for laboratory analysis.
IMPRESSION: Successful ultrasound guided paracentesis yielding 4.8 L of ascites.

## 2014-05-13 IMAGING — CR DG CHEST 2V
1 series · 3 of 3 positions shown · non-contrast
Comparison: 12/11/2013

CLINICAL DATA: Recurrent pleural effusion.

EXAM:
CHEST  2 VIEW

[Series 1: pa · 0.17mm/px · 3 of 3 slices shown]
[im 1/3]
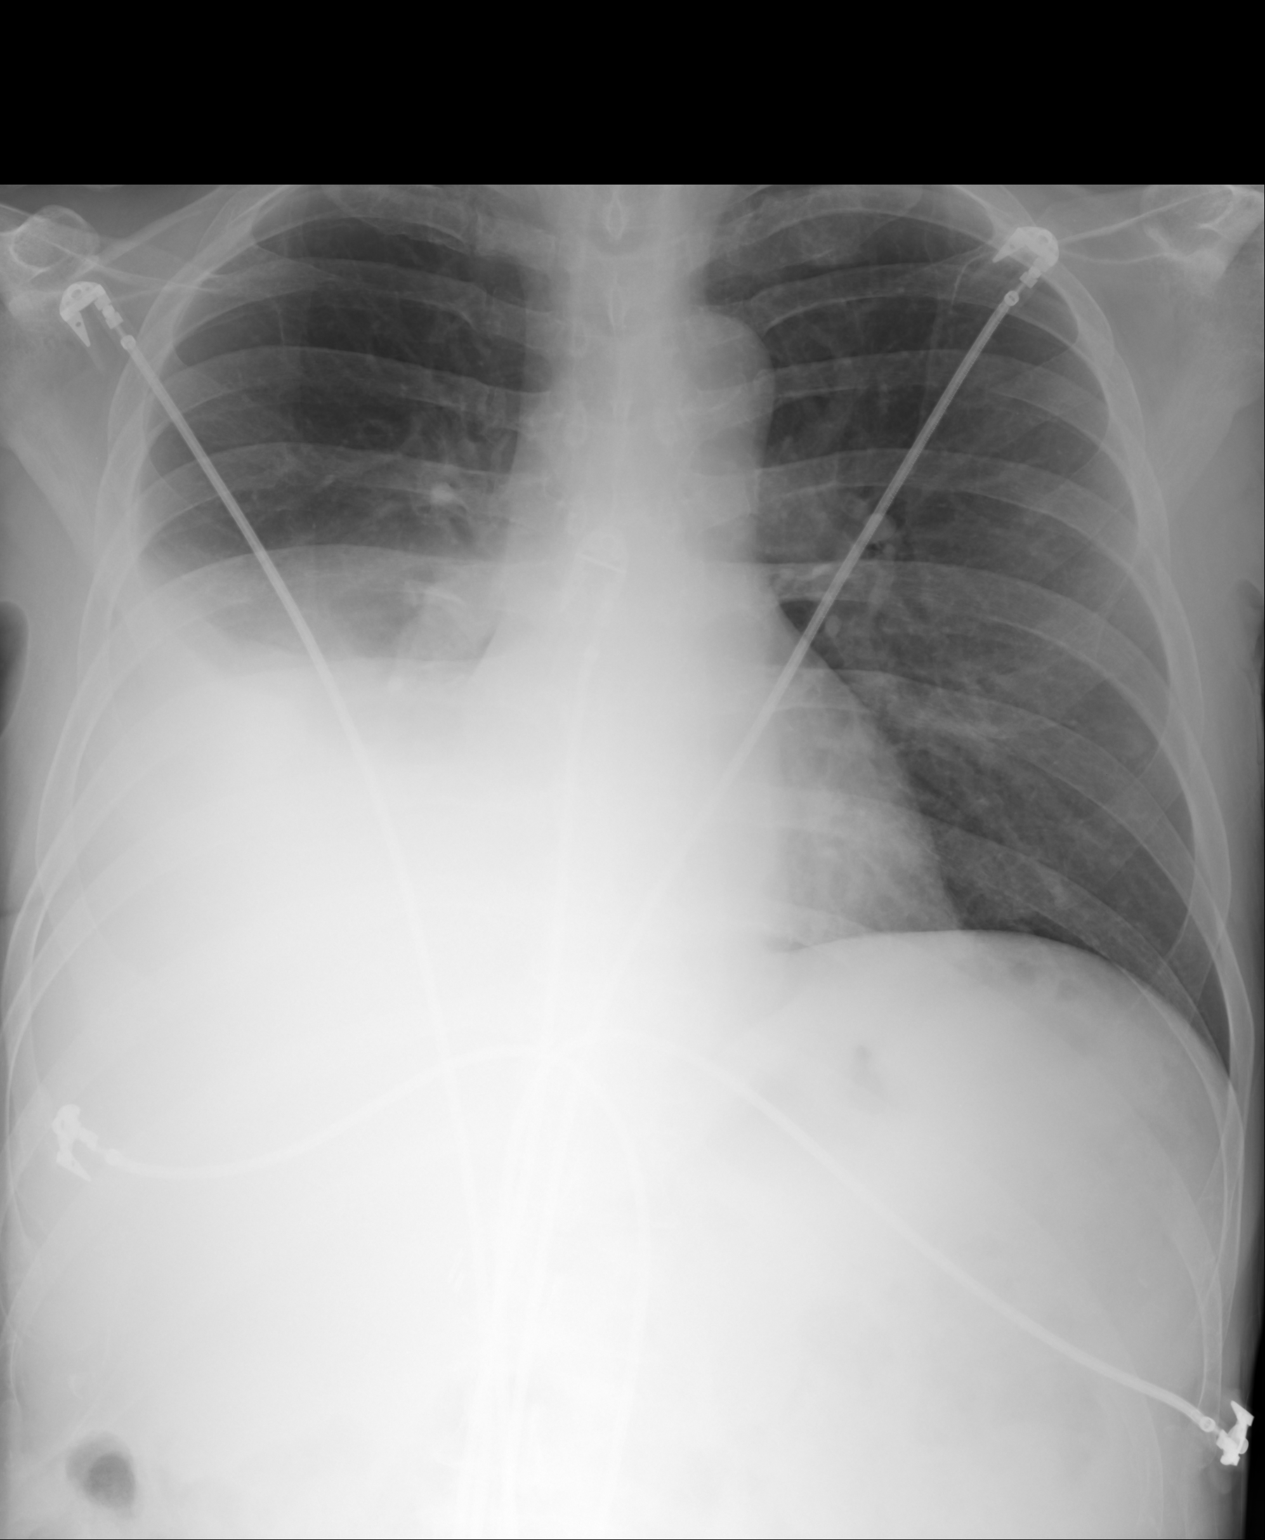
[im 2/3]
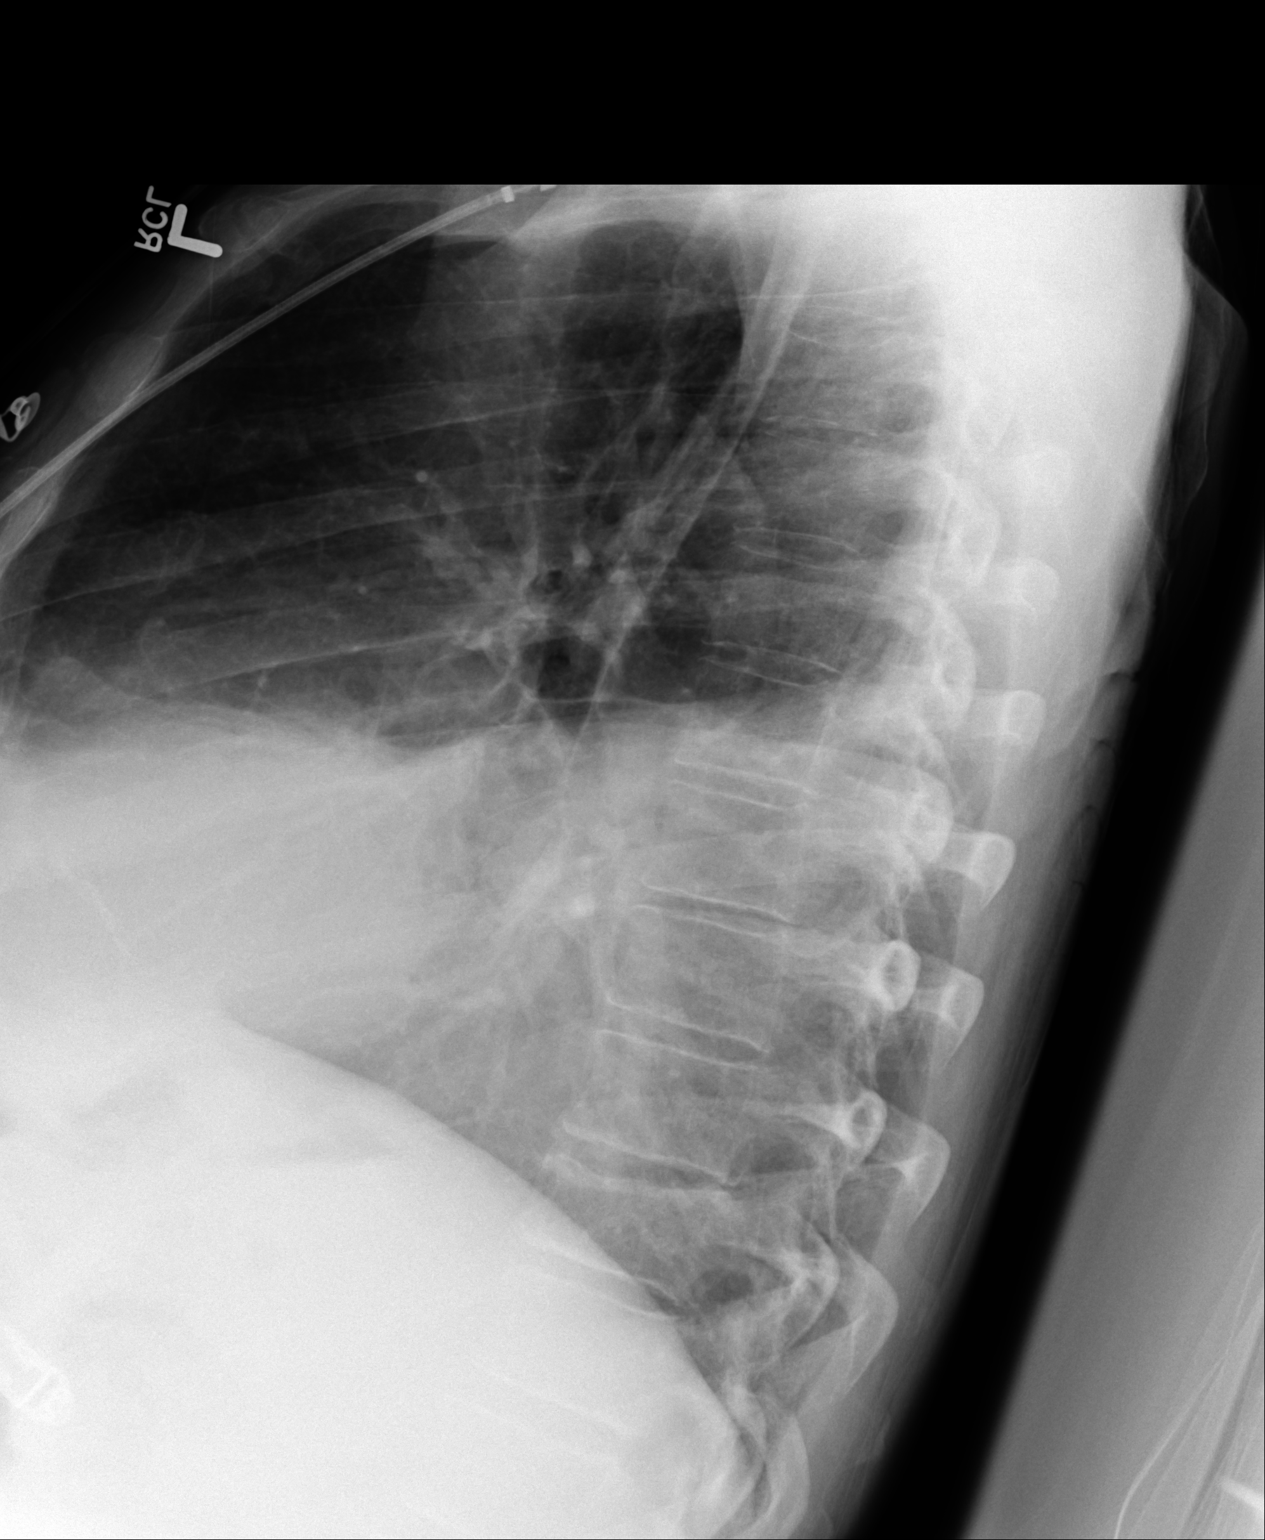
[im 3/3]
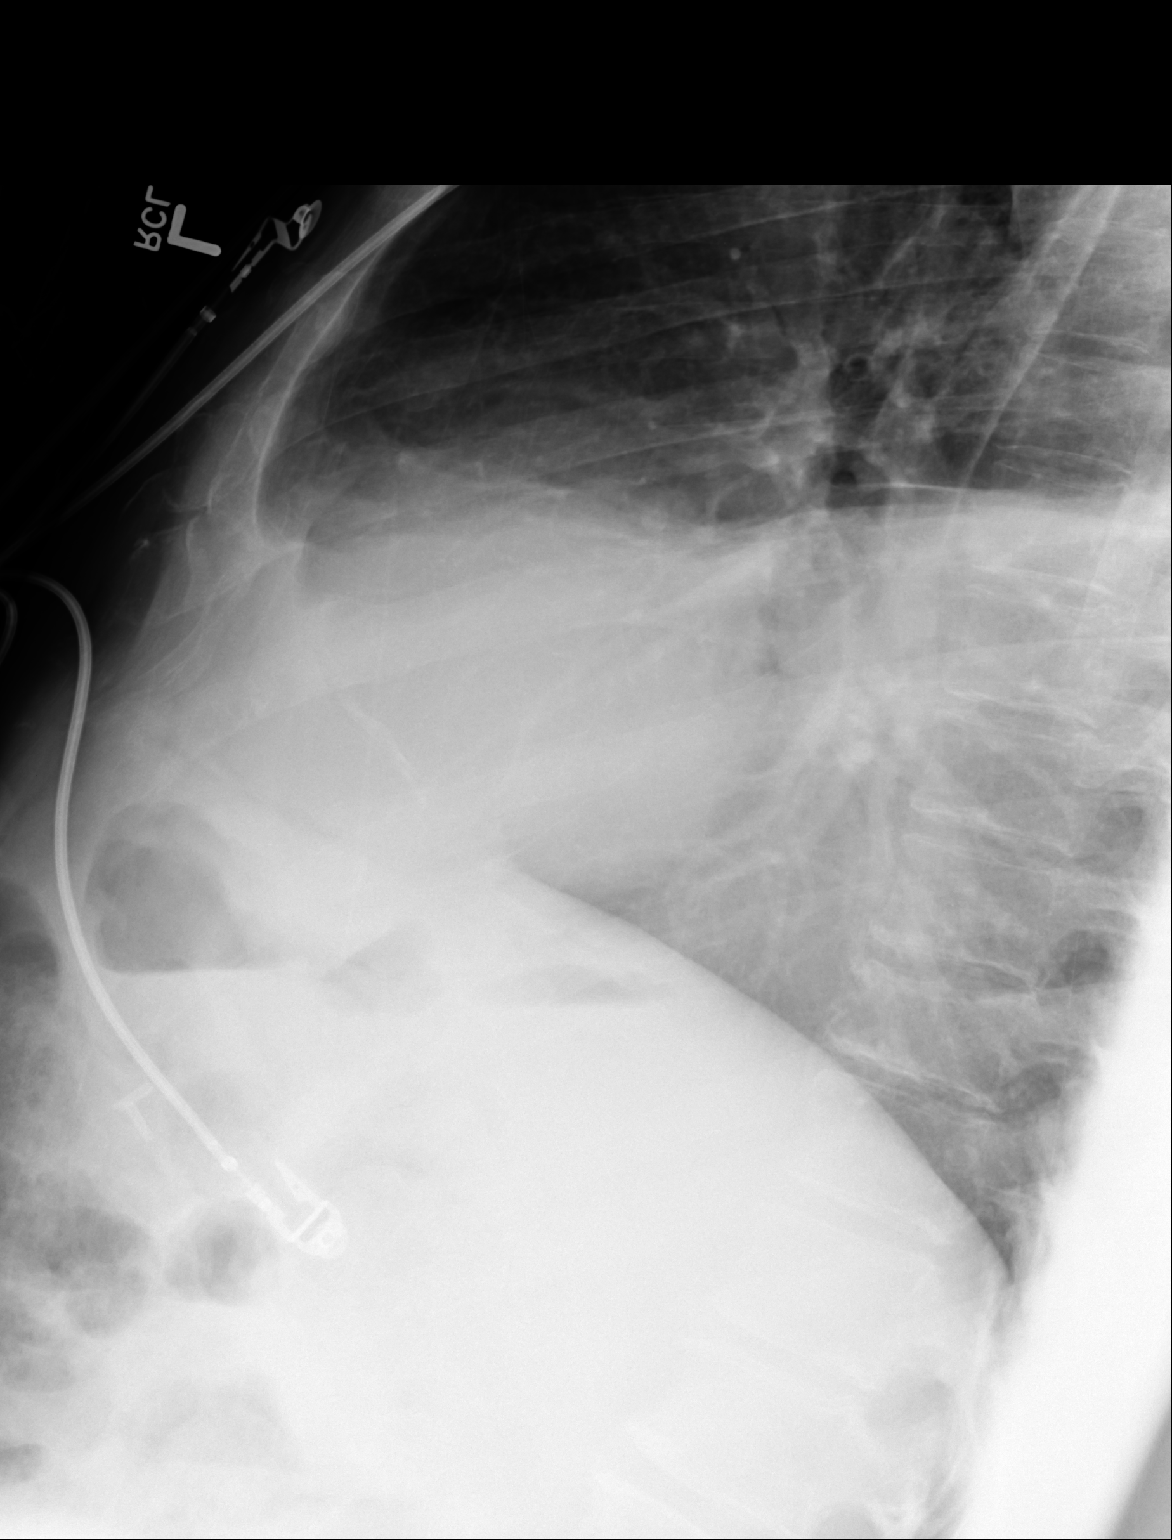

[3 of 3 positions shown; findings below may reference images not displayed]

FINDINGS: Increase in size of right pleural effusion occupying about 50% of
right hemithoracic volume. Associated passive atelectasis noted.

A 6 mm left upper lobe pulmonary nodule was present. Indistinct
lingular nodule noted. No left pleural effusion.

T10 compression fracture appears stable.
IMPRESSION: 1. Increase in size of right pleural effusion, currently 50% of
hemithoracic volume.
2. 6 mm left upper lobe nodule, with separate indistinct lingular
nodule.

## 2014-06-04 IMAGING — US CHEST ULTRASOUND
1 series · 7 of 7 positions shown · non-contrast
Comparison: Prior ultrasound guided right thoracentesis 12/15/2013

CLINICAL DATA: End-stage cirrhosis, evaluate for pleural effusion

EXAM:
CHEST ULTRASOUND

[Series 1: chest ultrasound · 0.27mm/px · 7 of 7 slices shown]
[im 1/7]
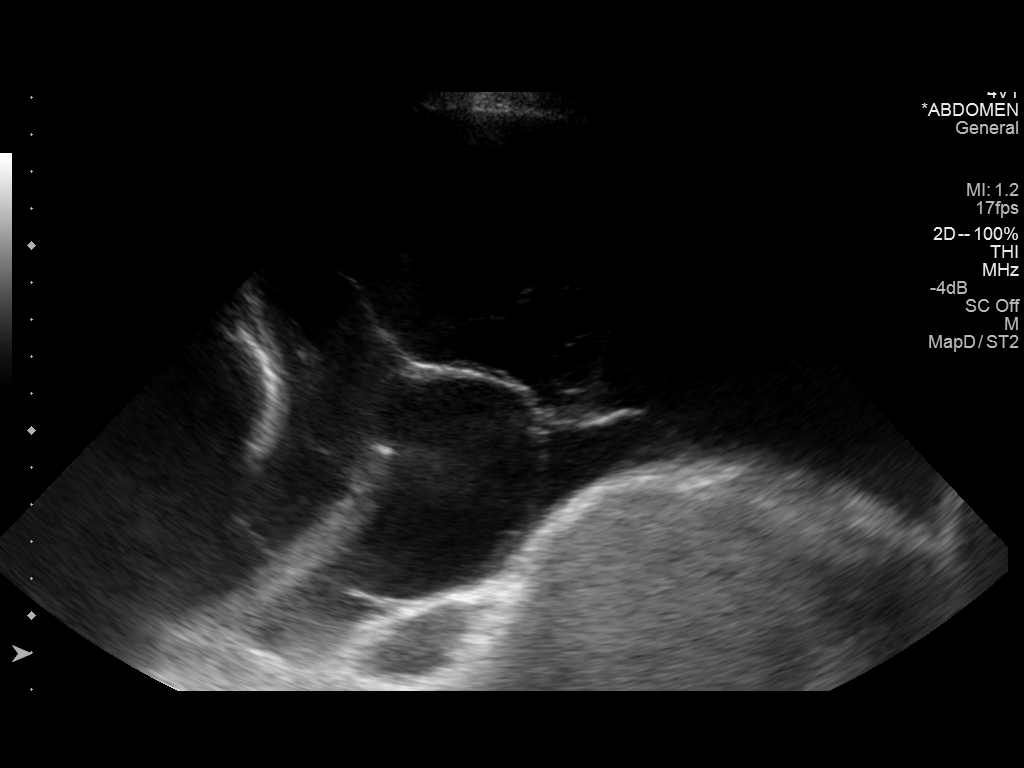
[im 2/7]
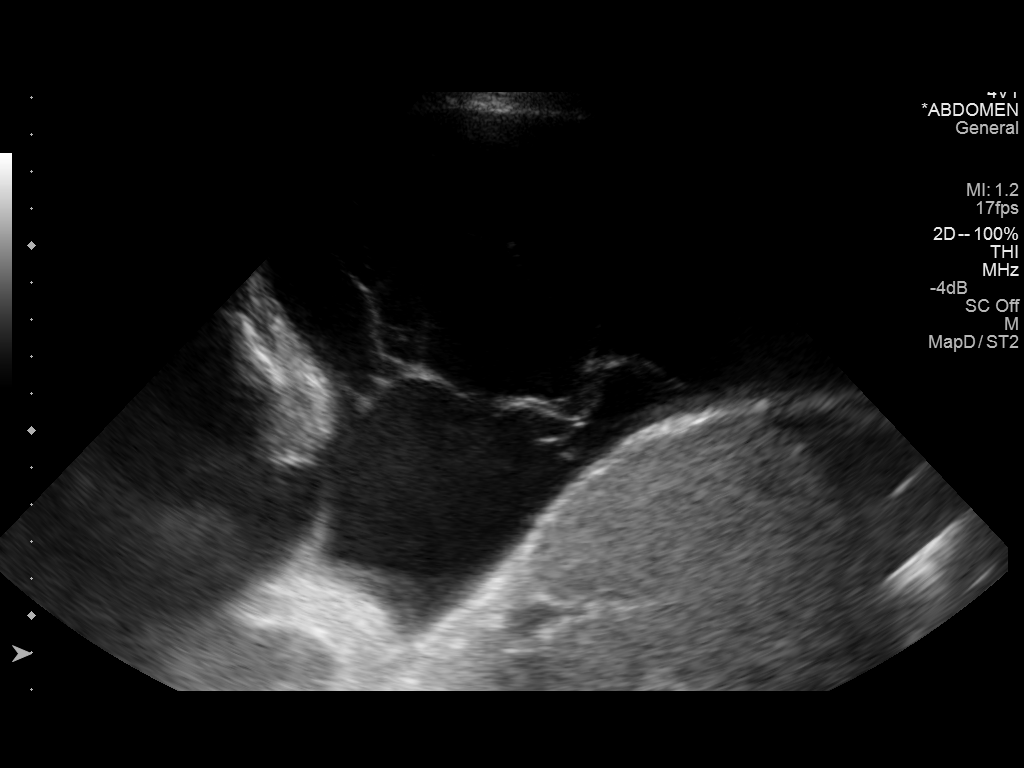
[im 3/7]
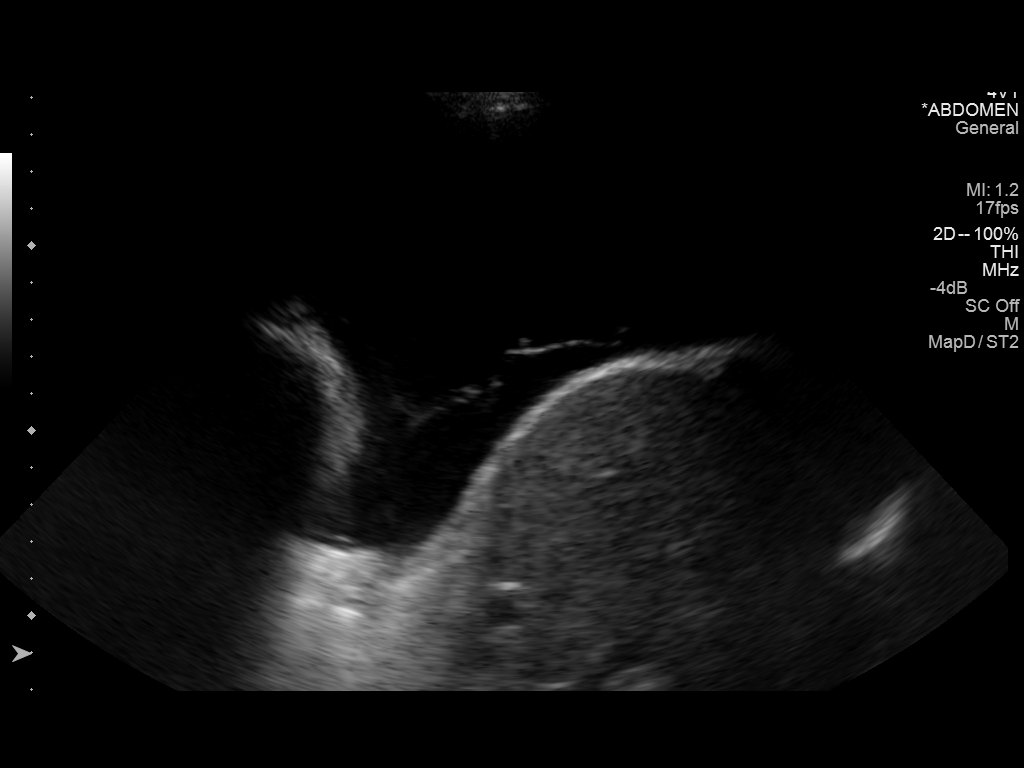
[im 4/7]
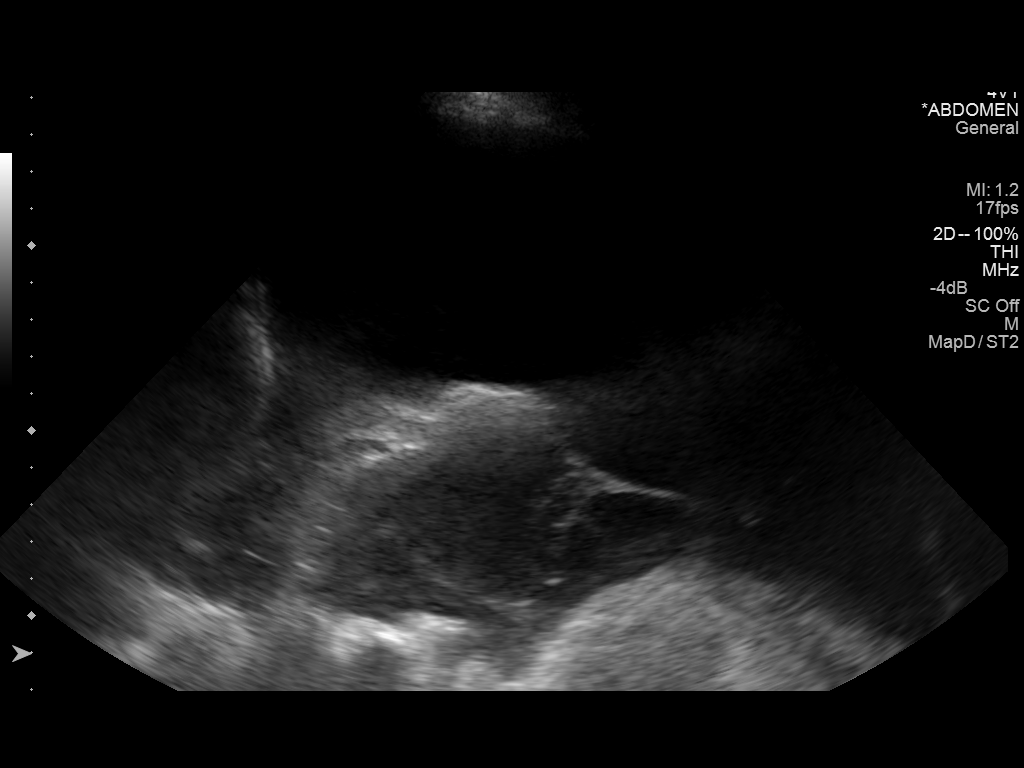
[im 5/7]
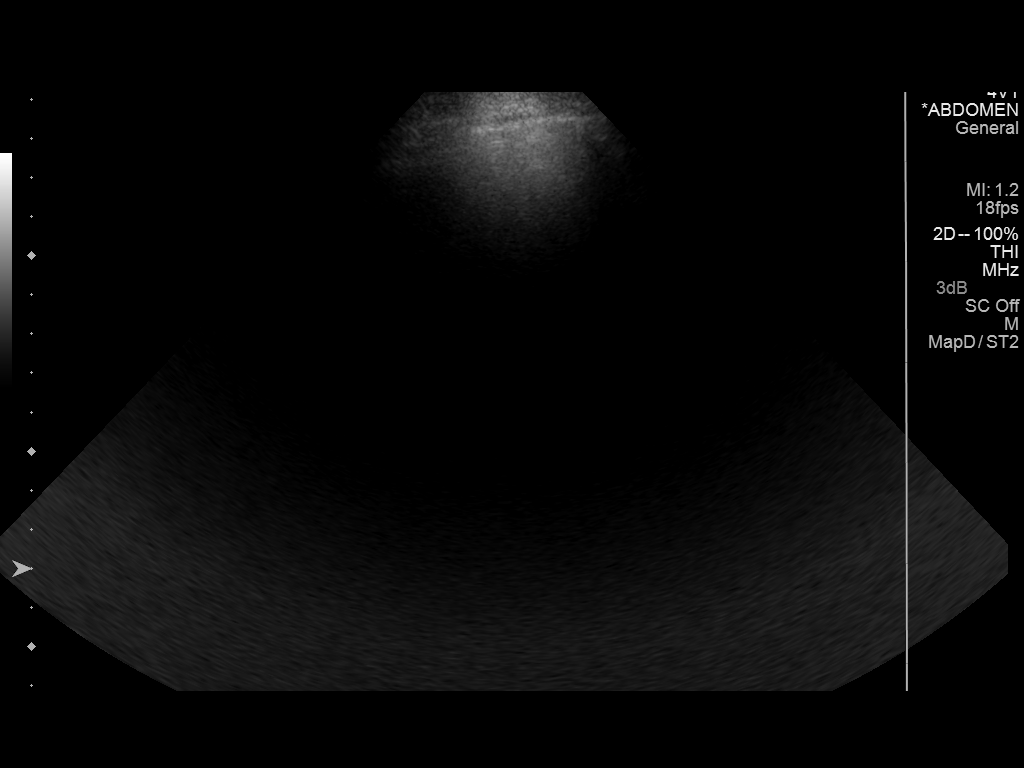
[im 6/7]
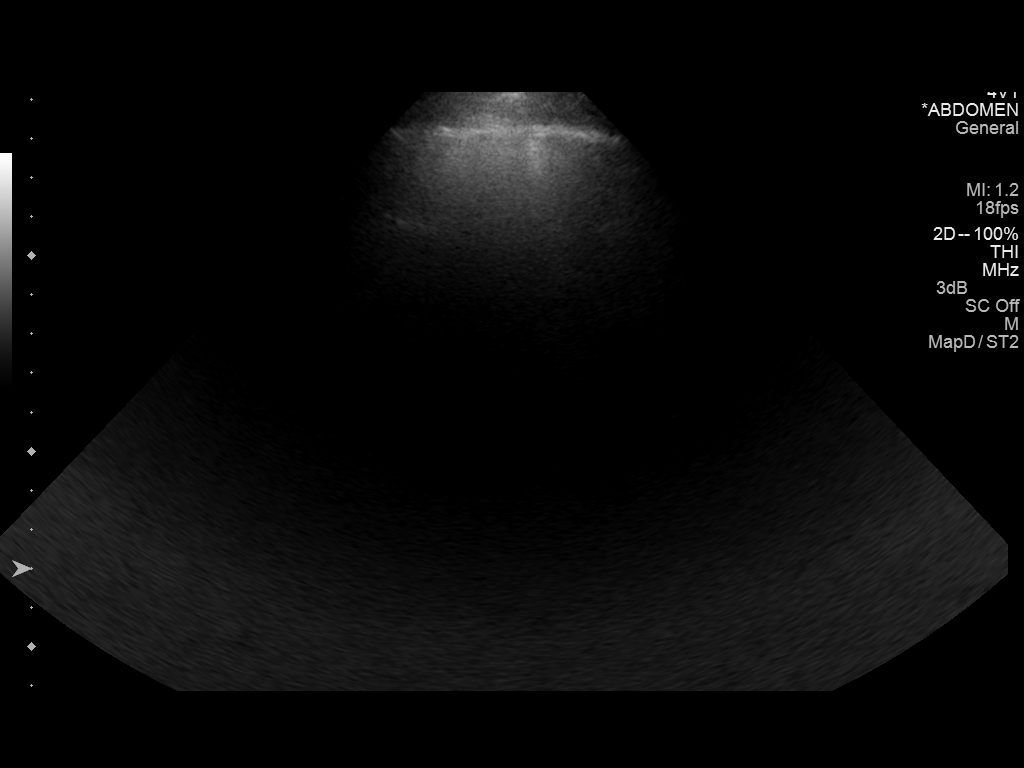
[im 7/7]
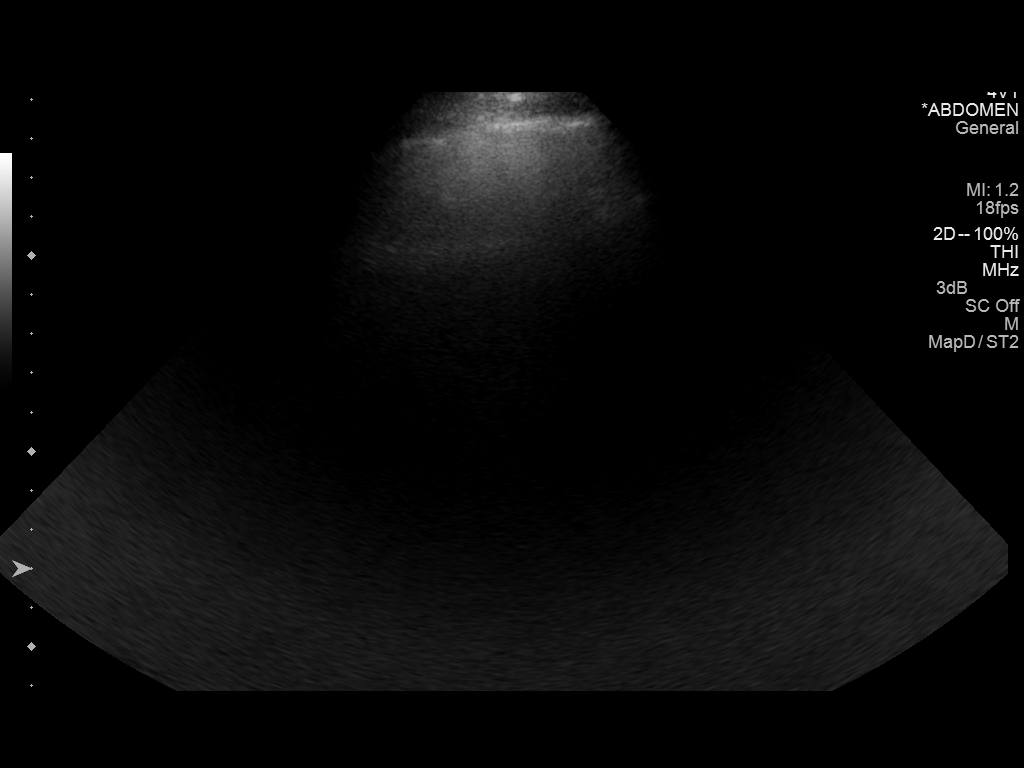

[7 of 7 positions shown; findings below may reference images not displayed]

FINDINGS: Focal sonographic evaluation of the right pleural space demonstrates
a moderately large pleural effusion with within it linear
synechiae/loculations noted dependently. Atelectasis of the right
lower lobe is also noted. Limited sonographic evaluation of the left
pleural space demonstrates a normally aerated lung without evidence
of pleural fluid.
IMPRESSION: Moderate right pleural effusion containing linear
synechiae/loculations.

## 2014-06-04 IMAGING — US US GUIDE NEEDLE - US [PERSON_NAME]
1 series · 2 of 2 positions shown · non-contrast
Comparison: December 15, 2013.

CLINICAL DATA: Right pleural effusion.

EXAM:
ULTRASOUND GUIDED right THORACENTESIS

[Series 1: us guide needle - us (person_name) · 0.26mm/px · 2 of 2 slices shown]
[im 1/2]
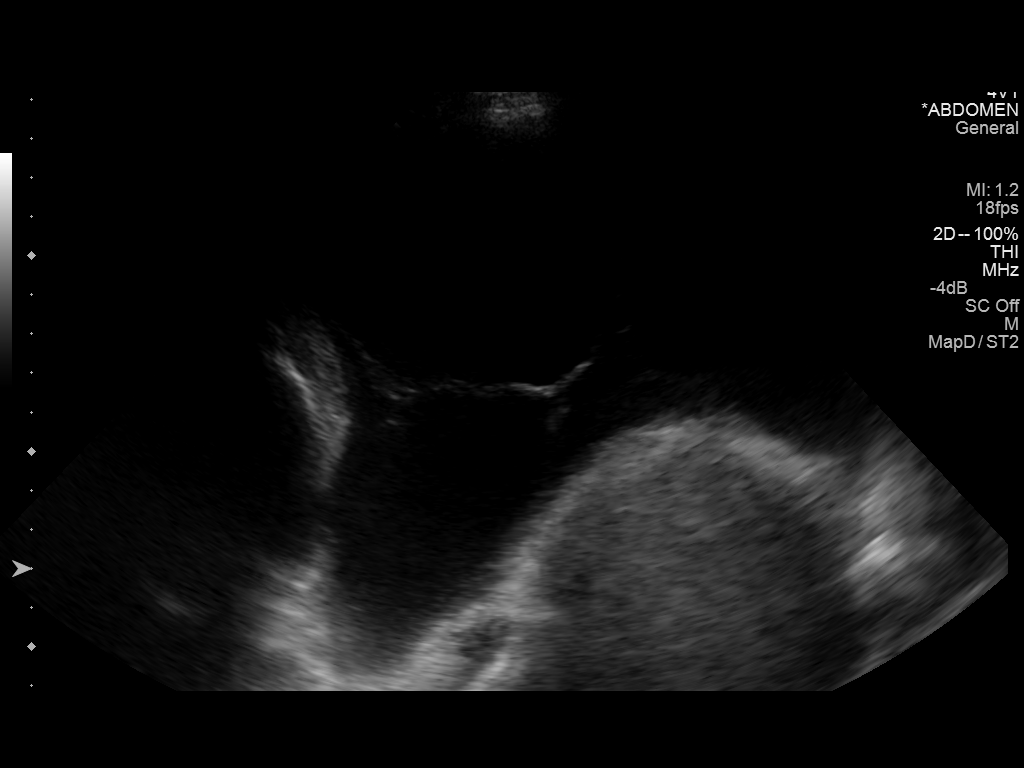
[im 2/2]
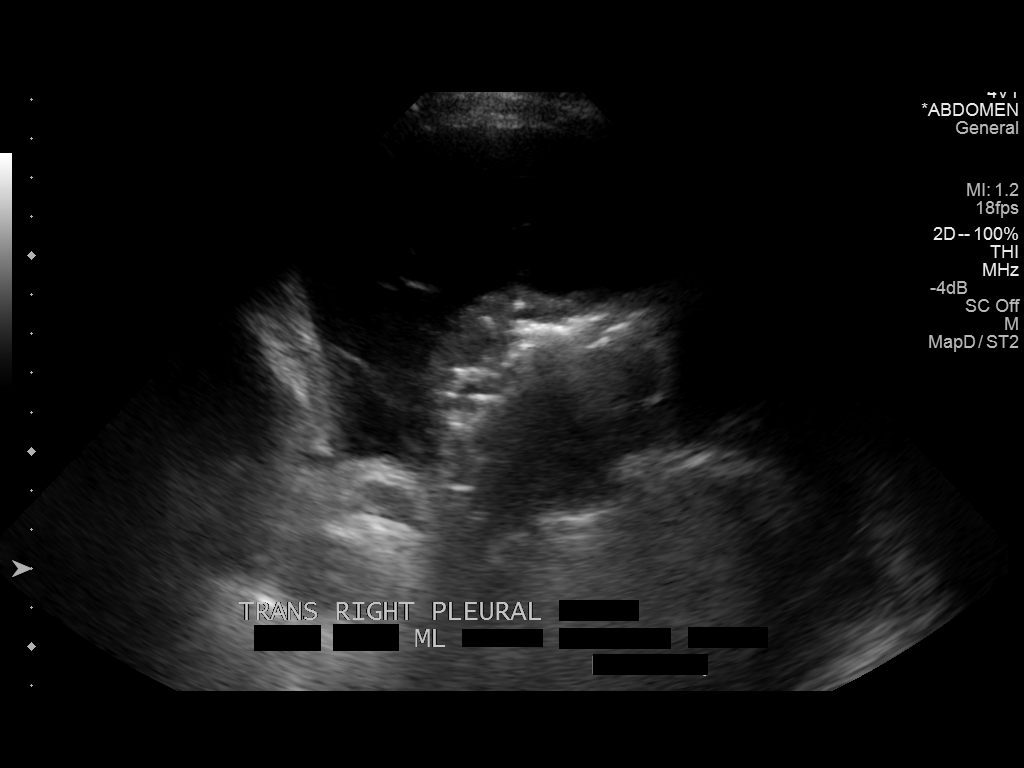

[2 of 2 positions shown; findings below may reference images not displayed]

PROCEDURE:
An ultrasound guided thoracentesis was thoroughly discussed with the
patient and questions answered. The benefits, risks, alternatives
and complications were also discussed. The patient understands and
wishes to proceed with the procedure. Written consent was obtained.

Ultrasound was performed to localize and mark an adequate pocket of
fluid in the right chest. The area was then prepped and draped in
the normal sterile fashion. 1% Lidocaine was used for local
anesthesia. Under ultrasound guidance a thoracentesis catheter was
introduced. Thoracentesis was performed. The catheter was removed
and a dressing applied.

Complications:  None immediate
FINDINGS: A total of approximately 4399 mL of serous fluid was removed. A
fluid sample wassent for laboratory analysis.
IMPRESSION: Successful ultrasound guided right thoracentesis yielding 4399 mL of
pleural fluid.

## 2015-03-31 NOTE — Consult Note (Signed)
CC: cirrhosis, encephalopathy due to elevated ammonia.  Much more awake and oriented.  NH3 down to 88, Mg 1.3,  TB 4, alb 2.1, Hgb 9.9, plt 69K.  Chest with decreased breath sounds in right lower field.  Pt admits to sometimes not taking enough lactulose.  Encourage compliance and low protein diet.  Complains of pain in his throat, likely from attempted NG insertion.  Electronic Signatures: Scot JunElliott, Aydan Levitz T (MD)  (Signed on 03-Nov-14 18:16)  Authored  Last Updated: 03-Nov-14 18:16 by Scot JunElliott, Ayliana Casciano T (MD)

## 2015-03-31 NOTE — Consult Note (Signed)
PATIENT NAME:  Calvin [name], Calvin [name] MR#:  [id number] DATE OF BIRTH:  1956-02-19  DATE OF CONSULTATION:  11/15/2013  REFERRING PHYSICIAN:  Starleen Armsawood S. Elgergawy, MD CONSULTING PHYSICIAN:  Keturah Barrehristiane H. Bryleigh Ottaway, NP  REASON FOR CONSULTATION: GI consult was ordered by Dr. Randol KernElgergawy to evaluate the patient for ascites.   HISTORY OF PRESENT ILLNESS: Appreciate consult for a 59 year old Caucasian man with history of liver cirrhosis, former alcohol abuse, for evaluation of ascites. [facility]. Reason for cirrhosis clear, possibly alcohol-related with muscle component, tests for hep B and hep C negative. ANA, AMA, ASMA negative. Alpha-1 antitrypsin normal with normal phenotype. Ceruloplasmin and iron studies normal and unaware if he has had liver biopsy. Had paracentesis today, states 5 liters removed, feeling better. This is his third paracentesis in the last few months. [facility]. States that he tries to be adherent to 2 grams sodium diet but really feels sometimes like he needs salt.  Is currently on Aldactone 100 mg p.o. daily and Lasix 20 mg p.o. daily, states adherence to these medications. Take lactulose b.i.d. to t.i.d. for HE prevention. Most recent ammonia 46. Also on Prevacid 30 mg p.o. daily, plus Zofran and Phenergan to use p.r.n. No longer on nadolol due to low blood pressure earlier this year in a hospitalization. Does report intermittent nausea, abdominal discomfort, occasional vomiting but less than in months past.  States his primary symptom is fatigue and lethargy, has frequent falls. These have been chronic complaints.  Due to these complaints, he has been assessed for heavy-metal, accumulation was negative; did have positive marijuana screen recently but negative last October. He states he is no longer using. States it has been many years since his last alcoholic beverage. [facility]  [facility], nausea, vomiting, hypertension. Has had hypokalemia. In chart review, has had low cortisol test last summer, does not know if this was re-evaluated, or not. INR 1.5, platelet count 117, stable anemia at 10.6.   PAST MEDICAL HISTORY: Liver cirrhosis, alcohol abuse, hypertension, multiple admissions with. HE and/or ascites, hyperlipidemia, anemia of chronic disease, thrombocytopenia, glaucoma, GERD, diverticulosis, colon polyps, hemorrhoids, allergic rhinitis, type 2 diabetes, hypomagnesemia, cholecystectomy, partial colectomy, vocal cord polypectomy, sinus surgery, hemorrhoid banding, Tonsillectomy, left knee arthroscopy, rotator cuff repair.   SOCIAL HISTORY: Quit smoking, no further illicits. States no further alcoholic use.   FAMILY HISTORY: Significant for liver cirrhosis and colorectal cancer.   REVIEW OF SYSTEMS: Ten systems reviewed, unremarkable other than what is noted above.   ALLERGIES: NKDA.   HOME MEDICATIONS: Aldactone 100 mg p.o. daily, Lasix 20 p.o. daily, Advair 2 puffs b.i.d., lisinopril 10 daily, Zofran p.r.n., promethazine p.r.n., lactulose 30 mg b.i.d. to q.i.d., rifaximin 550 mg b.i.d., Mag-Ox 400 daily, latanoprost 0.005% both eyes at bedtime, lansoprazole 30 mg p.o. daily, Allegra q.12 hours, MVI 1 daily.   MOST RECENT LABS: Glucose 120 BUN 15, creatinine 1.02, sodium 136, potassium 3.0, chloride 1.0, GFR greater than 60, calcium 8.5, lipase 237, ammonia 46, total protein 5.6, albumin 2.6, total bilirubin 3.2, ALP 152, AST 45, ALT 33. Troponin negative. WBC 8.2, hemoglobin 10.6, hematocrit 31, platelet count 117, red cells normocytic with increased RDW. PT 18, INR 1.5. He has a Child-Pugh class C score and a MELD of 11.   PHYSICAL EXAMINATION: MOST RECENT VITAL SIGNS: Temp 97.9, pulse 81, respiratory rate 18, blood pressure 94/53, SaO2 93% on room air.  GENERAL:  Thin, chronically ill-appearing man resting comfortably in bed in no acute distress.  HEENT:  Normocephalic, atraumatic. Sclerae with mild icterus. Mucous membranes pink and moist.  NECK: Supple. No thyromegaly or JVD.  CHEST: Respirations eupneic. Lungs clear bilaterally.  CARDIAC: S1, S2, RRR. No MRG. Peripheral pulses 2+. Trace lower extremity edema.  ABDOMEN: Flat, soft. Bowel sounds x 4. Nondistended, nontender. No hepatosplenomegaly, guarding, rigidity, rebound tenderness, peritoneal signs, masses or other abnormalities.  SKIN: Warm, dry, faint jaundice. Has several scabs and some ecchymotic areas. No erythema or rash.  EXTREMITIES: Mild generalized weakness but strength 5 out of 5. No clubbing or cyanosis.  NEUROLOGIC: No asterixis. Cranial nerves II through XII intact. Speech clear. No facial droop.  PSYCHIATRIC: Pleasant, calm, cooperative. Limited insight.   IMPRESSION AND PLAN:  Cirrhosis with ascites. Will start subacute bacterial peritonitis  prophylaxis due to his additional complaints. Low cortisol. Will reassess cortisol and assess ACTH before adjusting diuretics with a 24-hour urine and potassium. Still has alcoholic pattern to liver enzymes although these are stable from prior. Will get carbohydrate-deficient transferrin. We will continue to follow. He should continue to see Dr. Jacqualine Mau in regards to his desire for liver transplantation and additional medical center treatment.   Thank you very much for this consult. These services were provided by Vevelyn Pat, MSN, [facility], in collaboration with Army Chaco, M.D., with whom I have discussed this patient in full.    ____________________________ Keturah Barre, NP chl:cs D: 11/15/2013 18:06:12 ET T: 11/15/2013 18:32:10 ET JOB#: [id number]  cc: Keturah Barre, NP, <Dictator> Eustaquio Maize Flavius Repsher FNP ELECTRONICALLY SIGNED 11/17/2013 11:03

## 2015-03-31 NOTE — Consult Note (Signed)
PATIENT NAME:  ARBOR, Calvin [name] MR#:  [id number] DATE OF BIRTH:  08/24/1956  DATE OF CONSULTATION:  05/01/2013  CONSULTING PHYSICIAN:  Lollie Sails, MD  Patient of  Dr. Bridgette Habermann.   REASON FOR CONSULTATION:  GI followup for history of cirrhosis.   HISTORY OF PRESENT ILLNESS:  Calvin [name] is a 59 year old Caucasian male. I saw him several years ago, both in the hospital and the GI clinic in regards to issues with alcohol-related cirrhosis. He has subsequently stopped coming to the office for regular followup. He was brought to the hospital yesterday with altered mental status. His wife gave much of the initial history. He has a history of cirrhosis diagnosed at least 8 years ago. This may be a multifactorial issue in that there has been some type of chemical exposure as well as alcohol abuse. He states that he stopped drinking alcohol about 8 years or so ago. He was found to have an elevated ammonia level. He has been prescribed lactulose, which he takes very irregularly at home. He has not been following with a physician in regards to his problems of cirrhosis. He has had a history of hepatic encephalopathy in the past. He denies any problems with nausea or vomiting. There is no abdominal pain. There has been some mild discomfort recently due to increasing ascites. He has had no black stools, blood in the stools, or change of bowel habits. He typically will have a bowel movement only about every other day. He denies any dysphagia or odynophagia.   PAST MEDICAL HISTORY TO INCLUDE: 1.  Gastroesophageal reflux.  2.  History of left partial colectomy, I believe this for diverticulitis.  3.  Hernia repair.  4.  History of hepatic encephalopathy in the past.  5.  History of sinus surgeries.  6.  History of colon polyps.  7.  Diverticulitis/diverticulosis as noted.  8.  Cirrhosis of the liver, likely alcohol-related, possibly multifactorial. There are multiple family members who he reports having  cirrhosis of the liver without history of alcohol use.   GI FAMILY HISTORY:  Pertinent for cirrhosis as noted, as well as colorectal cancer.   OUTPATIENT MEDICATIONS INCLUDE:  Advair Diskus 550, 1 puff twice a day. Allegra-D 60 mg/120 mg, 1 every 12 hours p.[name].n. Fluticasone nasal spray. Hydrochlorothiazide/lisinopril 12.5 mg/20 mg twice a day. Klor-Con 20 mEq twice a day. Lactulose prescribed at 15 mL 3 times a day, likely noncompliant with this for the most part. Lansoprazole 30 mg a day. Latanoprost ophthalmic solution. Nadolol 80 mg twice a day. Proventil HFA 2 puffs q. 6 hours.   ALLERGIES:   He has no known drug allergies.   PHYSICAL EXAMINATION: VITAL SIGNS:  Temperature is 97.2, pulse 68, respirations 18, blood pressure 108/63.  GENERAL:  He is a 59 year old Caucasian male in no acute distress, somewhat sleepy/lethargic. He does answer questions appropriately.  HEENT:  Normocephalic, atraumatic. Eyes show scleral icterus. Nose: Septum midline.  Oropharynx:  No lesions.  NECK:  No JVD.  HEART:  Regular rate and rhythm.  LUNGS:  Clear.  ABDOMEN:  Protuberant. Mild ascites by physical examination/fluid wave. He is nontender, nondistended. Bowel sounds are positive, normoactive.  RECTAL:  Anorectal exam deferred.  EXTREMITIES:  Show 2+ lower extremity edema. It is of note that he also shows edema going up the posterior aspects of the legs toward the buttocks, from position.  NEUROLOGICAL:  Cranial nerves II through XII grossly intact. The patient has a very minimal asterixis.   LABORATORIES INCLUDE  THE FOLLOWING:  On admission to the hospital, he had a BUN of 13, creatinine 0.98, sodium 143, potassium 3.3, chloride 111, bicarb 25, ammonia 122. Hepatic profile showing a serum protein of 5.7, albumin 2.3, total bilirubin 3.5, alk phos 126, AST 36, ALT 22. Hemogram showing a white count of 4.8, H and H of  9.4/27.2, platelet count of 77, MCV is 100. Repeat labs this morning show his potassium at  3.4, ammonia declined to 70. TSH low at 0.362. He had a hemoglobin of 9.1, platelet count of 74. UA was negative. INR was 1.6. He had an abdominal ultrasound that shows liver relatively echogenic and dense, borders of the liver nodular, consistent with cirrhosis, main portal vein patent, status post cholecystectomy, bile duct 3 mm. Findings suggestive of cirrhosis with a mild amount of ascites.   ASSESSMENT:    1.  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 Etiology is possibly multifactorial, although positive history of alcohol abuse previously. The patient denies current alcohol use, or alcohol use over the past 8 years or so. He is more alert today after institution of treatment for the hepatic encephalopathy. He is not compliant with lactulose at home.   RECOMMENDATIONS:   1.  Would encourage the use of lactulose 30 mL twice a day at home. This may increase compliance somewhat, as he was prescribed it 3 times a day at a lower dose. The goal of the lactulose is to have 2 to 3 bowel movements per day. Other medications:   I would continue a low dose of Lasix, perhaps 20 to 40 mg a day, and start a low dose of Aldactone 25 mg a day. He was on a substantial dose of Nadolol, if he was taking the 80 mg 2 tablets daily, however, and this would give him prophylaxis for  variceal bleed as well. He does have a history of esophageal varices. Would continue the Nadolol at a  low-dose, at least, 20 to 40 mg a day, this being taken at about 5:00 p.m. to coincide with peak portal pressures later in the night. If he is unable to continue lactulose at 30 mL b.i.d. for some reason, could consider Xifaxan 550 mg p.o. b.i.d., although this is a more expensive drug for him.   I will be happy to continue his followup at the GI outpatient clinic, should he decide to do that. Will follow with you.    ____________________________ Lollie Sails, MD mus:mr D: 05/01/2013 17:00:09  ET T: 05/01/2013 20:53:57 ET JOB#: [id number]  cc: Lollie Sails, MD, <Dictator> Lollie Sails MD ELECTRONICALLY SIGNED 05/11/2013 15:08

## 2015-03-31 NOTE — Discharge Summary (Signed)
PATIENT NAME:  Calvin [name], Calvin [name] MR#:  [id number] DATE OF BIRTH:  1956/10/27  DATE OF ADMISSION:  11/15/2013 DATE OF DISCHARGE:  11/16/2013  ADMITTING DIAGNOSIS: Abdominal distention and abdominal pain.   DISCHARGE DIAGNOSES:  1.  Abdominal pain and distention due to ascites, status post paracentesis with improvement in his symptoms.  2.  Liver cirrhosis.  3.  Previous history of alcohol abuse.  4.  Hypertension.  5.  History of hepatic encephalopathy.  6.  Hyperlipidemia.  7.  Anemia of chronic disease.  8.  Thrombocytopenia.  9.  Large pleural effusion, status post thoracentesis during his recent hospitalization, felt to be due to his liver cirrhosis.  10. Anemia of chronic disease.  11. Hyperlipidemia.  12. Chronic thrombocytopenia.  13. 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  14. Glaucoma.  15. Gastroesophageal reflux disease.  16. Diverticulosis.  17. History of colon polyps.  18. History of hemorrhoids.  19. History of allergic rhinitis.  20. Type 2 diabetes.  21. Hypomagnesemia.  47. Status post cholecystectomy, partial colectomy, status post vocal cord polypectomy, sinus surgery, hemorrhoid banding, tonsillectomy, left knee arthroscopy and rotator cuff repair.   CONSULTANTS: Dr. Gustavo Lah and Dr. Rexene Edison.  PERTINENT LABS AND EVALUATIONS: Admitting glucose 120, BUN 15, creatinine 1.02, sodium 136, potassium 3.0, chloride 101, CO2 was 28, calcium was 8.5. Ammonia was 46, lipase 237. LFTs: Total protein 5.6, albumin 2.1, bilirubin total of 3.2, alk phos 152, AST is 45, ALT is 33. Troponin less than 0.02. WBC 8.2, hemoglobin 10.6, platelet count is 117. On  December 09, his platelet count was 68, which is close to his baseline. Thoracentesis fluid showed a yellow color and neutrophils 3. Abdominal three way showed moderately larger right-sided pleural effusion.   HOSPITAL COURSE: Please refer to H and P done by the admitting physician. The patient is a 59 year old white  male with history of liver cirrhosis and alcohol abuse, who is followed at [facility] for possible liver transplant, who has liver cirrhosis and history of pleural effusion, who presented to the ED with abdominal distention and pain. The patient was thought to have worsening ascites. Therefore, he was admitted for further treatment. The patient was placed under observation. He underwent a paracentesis with removal of close to 5 L of fluid. He received 25 grams of albumin post paracentesis. The patient also was complaining of some abdominal discomfort and has a history of inguinal hernia. He was seen by Dr. Rexene Edison, who felt that the hernias were not incarcerated and recommended no surgical intervention. The patient's pain is improved and he is doing much better and is stable for discharge.   DISCHARGE MEDICATIONS: Lansoprazole 30 mg daily, Allegra-D 60/120 one tab p.o. q.12 hours, Latanoprost ophthalmic 0.005% 1 drop to each eye at bedtime, fluticasone nasal spray 2 sprays to each nostril b.i.d., Proventil 2 puffs q.6 hour p.r.n., lactulose 30 mL 4 times a day, rifaximin 550 mg 1 tab p.o. b.i.d., Theragran multivitamins 1 tab p.o. daily, Spironolactone 100 mg daily, Zofran 4 mg 1 tab 4 times a day as needed, Lexapro 10 mg daily, Advair 230/21 mcg 2 puffs b.i.d., Lasix 20 mg daily, promethazine 25 mg q.6 hours p.r.n., sucralfate 1 gram t.i.d., oxycodone 5 mg q.6 hours p.r.n. for pain,  ciprofloxacin 750 mg 1 tablet every 7 days and KCl 10 mEq tablet once a day.   DIET: Low sodium.   ACTIVITY: As tolerated.   FOLLOWUP; With primary M.D. in 1 to 2 weeks. Follow up with Dr. Gustavo Lah  in 2 to 4 weeks. The patient is to followup on Wednesday with [facility], he already has an appointment.   NOTE: 35 minutes spent on this discharge.   ____________________________ Lafonda Mosses. Posey Pronto, MD shp:aw D: 11/17/2013 09:62:83 ET T: 11/17/2013 08:36:35 ET JOB#: [id number]  cc: Terrel Manalo H. Posey Pronto, MD, <Dictator> Alric Seton  MD ELECTRONICALLY SIGNED 11/19/2013 12:49

## 2015-03-31 NOTE — Discharge Summary (Signed)
PATIENT NAME:  Calvin [name], [name] MR#:  [id number] DATE OF BIRTH:  08-26-1956  DATE OF ADMISSION:  10/10/2013  DATE OF DISCHARGE:  10/13/2013  DISCHARGE DIAGNOSES:  1.  Acute hepatic encephalopathy, improved on lactulose and rifaximin.  2.  Aspiration pneumonia, improving on antibiotics. 3.  Large right-sided pleural effusion, likely from hepatic origin, status post thoracentesis.  4.  Acute renal failure, likely prerenal, resolved with IV hydration. 5.  Hypomagnesemia, repleted and resolved.  6.  Alcoholic cirrhosis.  7.  Chronic thrombocytopenia due to alcoholic cirrhosis. 8.  Anemia of chronic disease, stable.   SECONDARY DIAGNOSES: 1.  Hypertension.  2.  Hepatic encephalopathy.  3.  Hyperlipidemia.  4.  Glaucoma.  5.  Gastroesophageal reflux disease. 6.  Diverticulosis. 7.  Colonic polyps. 8.  Hemorrhoids.  9.  Type 2 diabetes.   CONSULTATIONS: GI, Dr. Lynnae Prudeobert Elliott. Surgery, Dr. Hulda Marinimothy Oaks.  PROCEDURES/RADIOLOGY: Ultrasound-guided thoracentesis on the right with successful removal of about 2.7 liters of clear/serous fluid.   Chest x-ray on second of November showed increasing density in the right lung base.   Abdominal x-ray on second of November showed NG tube projecting out on the course of the right main stem bronchus. Removal and reinsertion recommended.   CT scan of the head without contrast on second of November showed no acute intracranial abnormality. Chest x-ray on fourth of November showed marked interval increase in the right pleural effusion, now large in size.   CT scan of the chest without contrast on fourth of November showed large right pleural effusion with consolidated right lung. Cirrhosis and upper abdominal ascites.   Chest x-ray, status post thoracentesis on fifth of November showed no pneumothorax. Complete evacuation of the right pleural effusion and significantly improved pulmonary expansion.   HISTORY AND SHORT HOSPITAL COURSE: The patient is a  59 year old male with above-mentioned medical problems, who was admitted for altered mental status, thought to be due to acute hepatic encephalopathy, as his ammonia was significantly elevated up to 162, thought to be secondary to medication noncompliance. Please see Dr. Nicky Pughhen's dictated history and physical for further details. GI consultation was obtained with Dr. Mechele CollinElliott, who recommended continuing rifaximin and lactulose. The patient was slowly improving with decreased ammonia level. During the patient's stay, the patient was found to have increasingly large right pleural effusion, for which Cardiothoracic Surgery consultation was obtained with Dr. Hulda Marinimothy Oaks, who recommended ultrasound-guided thoracentesis, which was performed, with about 2.7 liter  serous fluid removal from the right side without any immediate complication. The patient was feeling significantly better, was close to his baseline clinical condition, with mental status improvement and significant improvement in shortness of breath.   He is being discharged in stable condition on November 5. On the date of discharge, his vital signs are as follows: Temperature 98.9, heart rate 103 per minute, respirations 22 per minute, blood pressure 123/75 mmHg, he is saturating 95% on room air.   PHYSICAL EXAMINATION ON THE DAY OF DISCHARGE: CARDIOVASCULAR: S1, S2 normal. No murmurs, rubs, gallops.  LUNGS: Clear to auscultation bilaterally. No wheezing, rales, rhonchi, or crepitation.  ABDOMEN: Soft, benign.  NEUROLOGIC: Nonfocal examination.  All other physical examination remained at baseline.   DISCHARGE MEDICATIONS: 1.  Lansoprazole 30 mg p.o. daily. 2.  Allegra D 1 tablet p.o. b.i.d.  3.  Latanoprost 0.075%, 1 drop to each eye at bedtime. 4.  Advair Diskus 500/50, 1 puff inhaled twice a day.  5.  Flonase 2 sprays to each nostril twice a  day.  6.  Proventil 2 puffs inhaled every 6 hours as needed. 7.  Lactulose 30 mL p.o. 4 times a day.   8.  Rifaximin 550 mg p.o. b.i.d.  9.  Theragran once daily.  10.  Lasix 20 mg 1-1/2 tablet p.o. daily.  11.  Spironolactone  100 mg p.o. daily.  12.  Magnesium oxide 400 mg p.o. daily. 13.  Klor-Con 20 mEq p.o. b.i.d.  14.  Zofran 4 mg p.o. 4 times a day as needed.  15.  Augmentin 875 mg/125 mg, 1 tablet p.o. b.i.d. for 3 more days.   DISCHARGE DIET: Low sodium.   DISCHARGE ACTIVITY: As tolerated.   DISCHARGE INSTRUCTIONS AND FOLLOWUP: The patient was instructed to follow up with his primary care physician, Dr. Dorothey Baseman, in 1 to 2 weeks. He will need follow up with [facility], Dr. Suzan Slick, in 2 to 4 weeks for evaluation of his liver transplant.  Total time discharging this patient was more than 55 minutes.     ____________________________ Ellamae Sia. Sherryll Burger, MD vss:mr D: 10/13/2013 17:08:10 ET T: 10/13/2013 19:26:21 ET JOB#: [id number]  cc: Wyolene Weimann S. Sherryll Burger, MD, <Dictator> Teena Irani. Terance Hart, MD Scot Jun, MD Sheppard Plumber. Thelma Barge, MD   DR. Suzan Slick, [facility]  Ellamae Sia [facility] MD ELECTRONICALLY SIGNED 10/19/2013 12:04

## 2015-03-31 NOTE — H&P (Signed)
PATIENT NAME:  Calvin [name], [name] MR#:  [id number] DATE OF BIRTH:  Aug 12, 1956  DATE OF ADMISSION:  06/24/2013  CHIEF COMPLAINT: changes in mental status, increased urination and confusion.   PRIMARY CARE PHYSICIAN: Dorothey Basemanavid Bronstein, MD  REFERRING PHYSICIAN:  Osie CheeksNoel McLaurin, MD  HISTORY OF PRESENT ILLNESS: This is a 59 year old gentleman with history of cirrhosis of the liver, hypertension, reflux, history of hyperlipidemia, diabetes type 2, multiple admissions with hepatic encephalopathy, chronic thrombocytopenia and anemia of chronic disease. The patient comes today with a history of fall that happened 2 weeks ago. The patient has been evaluated in a different hospital for this and she had a CT scan that did not show any major abnormalities. The patient was discharged in good condition. The patient followed up with Dr. Pia MauBowles and today he has been sent over here because of the decline in his mental status. The patient is more confused. The patient is actually trying to get out of the bed all the time here. At home he has been more tired, more confused. He has been urinating all over the house and having bowel movements on the rollator wlaker  instead of his bedside commode. The wife brought him over here because she is frustrated with all of this and she is concerned about his confusion. The patient is not able to give me a full review of systems or any information as he is lethargic and confused, but the family is able to tell me some of the details. Apparently when the patient had a fall, he was trying to get up out of the chair and hit his face against a wooden desk.  Again, after that, he was evaluated in another ER department and he had a CT scans of the neck and head without any significant bleeding. The patient has a hematoma of the right cheek and some ecchymosis at the level of the right face, jaw, neck and upper chest. The patient was actually behaving normally since then up until the last 3 days when  he started to get more confused and lethargic on and off. The patient is evaluated in the Emergency Department. His ammonia level is 63. His creatinine is elevated from baseline. Usually baseline is around 0.9; today it is 1.22. His BUN is elevated at 22.  He clinically looked dehydrated and apparently his Lasix has been increased to control his ascites. The family states that he has had at least 39 pounds weight loss since his Lasix has been increased. The patient looks clinically dehydrated.  The patient is admitted for further evaluation.   REVIEW OF SYSTEMS:  Unable to pain. Please see HPI.  We cannot obtain review of systems due to the patient's confusion.   PAST MEDICAL HISTORY: 1.  Cirrhosis of the liver.  2.  Alcohol abuse in the past.  3.  Hypertension.  4.  Diverticulosis with diverticulitis in the past.  5.  Colon polyps.  6.  Multiple admissions with hepatic encephalopathy.  7.  Hyperlipidemia.  8.  Thrombocytopenia.  9.  Anemia of chronic disease.  10.  Glaucoma.  11.  GERD. 12.  Hemorrhoids.  13.  Allergic rhinitis.  14.  Type 2 diabetes, non-insulin-dependent.   PAST SURGICAL HISTORY: 1.  Tonsillectomy as a child.  2.  Left knee arthroscopy in 1991.  3.  Rotator cuff repair in 1996. 4.  Hemorrhoidal banding in 2002.  5.  Sinus surgery in 2002 and 2005.  6.  Partial colectomy secondary to diverticulitis and diverticulosis  in 2007.  7.  Vocal cord polypectomy in 2007.  8.  Cholecystectomy in 2009.   ALLERGIES: No known drug allergies.  FAMILY HISTORY: Positive for colorectal cancer and cirrhosis.   SOCIAL HISTORY: The patient used to be a smoker. He smoked up to 1 pack a day for more than 30 years. He quit in 1990. He occasionally uses snuff and dips, about a can per week, since 1995.  Alcohol abuse. He quit in 2007. He is no longer working. He lives with his wife.   MEDICATIONS:  1.  Proventil as needed. 2.  Latanoprost 1 drop in each eye once a day. 3.   Lansoprazole 30 mg once a day. 4.  Lactulose 30 mL 3 times a day. 5.  Klor-Con 20 mEq twice daily.  6.  Furosemide recently increased to 40 mg once daily. 7.  Fluticasone 50 mcg spray. 8.  Allegra D 60 mg every 12 hours. 9.  Aldactone 25 mg once daily. 10.  Advair Diskus 500/50 mcg twice daily.   PHYSICAL EXAMINATION: VITAL SIGNS: Blood pressure 119/59, pulse 82, respirations 20, temperature 97.8, oxygen saturation 99% on room air.  GENERAL: The patient is lethargic, but he awakes to stimulation. He answers questions randomly and he is overall pleasantly confused.  HEENT: Pupils are equal and reactive. Extraocular movements are intact. Mucosa is dry. Icterus is positive on sclerae. No injection of the conjunctivae. No oral lesions. No oropharyngeal exudates. No thrush.  NECK: Supple. No JVD. No thyromegaly. No adenopathy. No carotid bruits. No rigidity.  HEART:  Regular rate and rhythm. No murmurs, rubs or gallops. No tenderness to palpation over anterior chest wall. No displacement of PMI.  LUNGS: Clear without any wheezing or crepitus. No use of accessory muscles. No dullness to percussion.  ABDOMEN: Soft, nontender, nondistended. No hepatosplenomegaly. No masses. Bowel sounds are positive. No significant distention.  No rebound tenderness. No guarding.  GENITAL: Deferred.  EXTREMITIES: No significant edema, cyanosis or clubbing. Pulses +2. Capillary refill is about 4 to 5 seconds.  LYMPHATIC: Negative for lymphadenopathy in neck or supraclavicular areas.  SKIN: With jaundice tone. The patient has large ecchymosis at the level of the right face, cheek, jaw, neck and upper chest which is purple and starting to turn into greenish discoloration. The patient has also a hematoma located below the surface of the right cheek and is about golf ball size. It is hard but not tender.  MUSCULOSKELETAL: No significant joint abnormalities or effusions.  PSYCH:  The patient is confused. He does not know  where he is.  He cannot answer most of my questions. Otherwise he is normocephalic, atraumatic, other than the hematoma.   LABORATORY AND DIAGNOSTICS: His glucose is 105, BUN 22 and creatinine 1.22. His regular creatinine is around 0.8/0.9 and his BUN is always around 12. Right now doubles up to  22. Sodium 136, potassium 4.2 and calcium is 10.  Magnesium is pending. Previous to that it was 1.5. Ammonia level is 63. His albumin is 2.9. His bilirubin is 4.9. Usually his bilirubin is in between 2.5 and 3. His alkaline phosphatase is 158 and his AST is 43, which they are always within normal limits, except for the AST is slightly elevated in the 50s. Troponin is negative. His TSH was checked in May and was low. We are going to recheck today. White count is 8.3, usually is around 4.  Hemoglobin is 11.7, usually is around 9.  Platelets are 130, usually is in the 70s,  which that might be signs of hemoconcentration. His INR is 1.4. His prothrombin time is 17. Urinalysis is pending.   Chest x-ray: I ordered just now.   ASSESSMENT AND PLAN: This is a 59 year old gentleman with history of multiple admissions for hepatic encephalopathy. He has cirrhosis and hypertension.  He comes today with a history of fall within the last 2 weeks, but now having difficulty walking, urinating all over the house and more confusion.  1.  Altered mental status due to encephalopathy, multifactorial. This is hepatic encephalopathy on top of possible metabolic encephalopathy. The patient has clinical signs of dehydration that happened after his Lasix dose has been increased.  Probably the patient just needs to be hydrated and also take his lactulose. Apparently he has been taking his lactulose 2 to 3 times a day and his stool is formed and happens 2 to 3 times a day. Maybe he just needs a bump on his dose of lactulose. We are going to give him 30 mL stat p.o.  2.  Dehydration. Clinical signs of dehydration, dry skin, dry mucosa. The  patient has BUN that is doubled than usual and his creatinine has bumped from 0.8 to 1.22. The patient has a recent increase of the dose of Lasix to 40 mg, prior he was taking 20.  Likely he is a little bit over diuresed. He had weight loss of 39 pounds within the past couple of weeks. His ascites has decreased significantly. No signs of hepatorenal syndrome. We are going to hydrate gently. Since the patient has cirrhosis we are going to use NS, avoid to LR, and we are going to avoid large amounts of fluid due to his potential for fluid retention due to his cirrhosis.  3.  Hypomagnesemia. Replace today.  4.  Worsening of liver function tests. His bilirubin is usually around 2.5, today is 4.9. Alkaline phosphatase is slightly elevated at 158. His AST is 4.3. This could be related to dehydration, but mostly also worsening of his cirrhosis. Continue to monitor. Gastroenterology consultation if tomorrow his LFTs are worse.  5.  Abnormal thyroid test. In the past, his thyroid test has been low likely due to stress related disease. We are going to recheck his TSH. The patient is not on treatment with thyroid hormone.  6.  Anemia of chronic disease. His hemoglobin is usually around 9, today is 11.7. This is likely due to hemoconcentration.  7.  Thrombocytopenia. 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 The patient's platelets today are 130, usually are 70,000. Again, this is likely due to hemoconcentration.  8.  Coagulopathy. He is stable. His INR today is 1.4. He has a large hematoma, but there are no signs of active bleeding at this moment.  9.  Increased urinary frequency. This is likely secondary to Lasix. The patient is more confused, and he is urinating all around the house. There is no urinalysis to evaluate for urinary tract infection, but we are going to send this stat and treat him if necessary.  10.  The patient is a FULL CODE.  11.  Gastrointestinal prophylaxis with  Protonix.   TIME SPENT: About 50 minutes with this admission.  ____________________________ Felipa Furnace, MD rsg:sb D: 06/24/2013 15:48:40 ET T: 06/24/2013 16:15:41 ET JOB#: [id number]  cc: Felipa Furnace, MD, <Dictator> Calvin Dicenzo Juanda Chance MD ELECTRONICALLY SIGNED 06/24/2013 20:31

## 2015-03-31 NOTE — Discharge Summary (Signed)
PATIENT NAME:  Calvin [name], [name] MR#:  [id number] DATE OF BIRTH:  01-08-1956  DATE OF ADMISSION:  07/24/2013 DATE OF DISCHARGE:  07/24/2013  CHIEF COMPLAINT: Nausea or vomiting   PRIMARY CARE PHYSICIAN:  Dr. Terance HartBronstein   DISCHARGE DIAGNOSES: 1.  Nausea, vomiting, unclear etiology, resolved.  2.  Mildly elevated lipase, possibly from emesis improved. History of liver cirrhosis with noncompliance with his medications in the past.  3.  Hypertension.  4.  Multiple admissions for hepatic encephalopathy.  5.  Hyperlipidemia.  6.  History of anemia of chronic disease.  7.  Thrombocytopenia.  8.  Glaucoma.  9.  Gastroesophageal reflux disease.  10.  History of diverticulosis.  11.  Colon polyps.  12.  Hemorrhoids.  13.  Type 2 diabetes.  14.  History of falls.  15.  History of hypomagnesemia.   DISCHARGE MEDICATIONS:  Lansoprazole 30 mg daily, Allegra-D 12 hour 1 tab every 12 hours, latanoprost ophthalmic 0.005% one drop to each eye once a day at bedtime, Advair 500/50 mcg 1 puff 2 times a day, fluticasone nasal 50 mcg per nasal spray 2 sprays 2 times a day, furosemide 20 mg daily, Aldactone 25 mg 1 tab once a day, Proventil HFA 2 puffs every 6 hours as needed for shortness of breath. Lactulose 30 mL 4 times a day, magnesium oxide 400 mg 1 tab once a day, Lexapro 10 mg daily, rifaximin 550 mg 2 times a day, Theragran multivitamin oral tab 1 tab once a day, promethazine 25 mg every 6 hours as needed for nausea and vomiting.   DIET: Low sodium, low fat, low cholesterol.   ACTIVITY: As tolerated.   DISCHARGE INSTRUCTIONS: Please follow with PCP within 1 to 2 weeks.    HISTORY OF PRESENT ILLNESS AND HOSPITAL COURSE:  For full details of H and P, please see the dictation on the 07/24/2013 by Dr. Rudene Rearwish, but briefly this is a [age] year old male who was in the hospital last month for left hepatic encephalopathy secondary to noncompliance and [facility]  He came into the hospital with complaints of repetitive vomiting and was admitted to the hospitalist service. He was uncooperative to the ER admitting physician. He received Zofran and Phenergan with some gentle IV fluids and admitted to the hospitalist service overnight. By morning he feels better. He was tolerating diet.  He has had no further vomitus. He has no abdominal pain, nausea, shortness of breath. Of note, he did have mildly elevated lipase on arrival, likely secondary to vomiting and it did improve to 276 from 458 on arrival. His ammonia level was noted to be 69, but he is awake, alert, oriented x3, cooperative without a tremor.   PHYSICAL EXAMINATION:   VITAL SIGNS:  On the morning of discharge, his temperature was 98.4, pulse rate was 103, respiratory rate was 18, blood pressure 120/71, O2 saturation was 95%.   GENERAL:  The patient is a well-developed male lying in bed in no obvious distress, talking in full sentences.  HEENT:  Normocephalic, atraumatic.  HEART:  Auscultation of the heart sounds show S1, S2 but bradycardic. No murmurs, rubs or gallops.  LUNGS: Clear to auscultation.  ABDOMEN: Soft, nontender, nondistended. Positive hyperactive sounds in all quadrants.  EXTREMITIES: Show no significant lower extremity edema.   At this point he will be discharged with outpatient follow-up.   Total Time Spent: 35 minutes.    ____________________________ Krystal EatonShayiq Mekaylah Klich, MD sa:dp D: 07/24/2013 14:27:27 ET T: 07/24/2013  15:59:02 ET JOB#: [id number]  cc: Krystal Eaton, MD, <Dictator> Teena Irani. Terance Hart, MD  Krystal Eaton MD ELECTRONICALLY SIGNED 08/01/2013 12:32

## 2015-03-31 NOTE — H&P (Signed)
PATIENT NAME:  Calvin [name], Lindbergh E MR#:  [id number] DATE OF BIRTH:  [birth year]  DATE OF ADMISSION:  10/10/2013  PRIMARY CARE PHYSICIAN:  Teena Iraniavid M. Terance HartBronstein, MD  REFERRING PHYSICIAN:  Dr. Margarita GrizzleWoodruff.    CHIEF COMPLAINT: Decreased mental status today.   HISTORY OF PRESENT ILLNESS: A 59 year old Caucasian male with a history of liver cirrhosis and alcohol abuse in the past was sent to ED due to decreased mental status. Patient is disoriented and noncommunicative now, unable to provide any information. I called patient's wife. The patient's wife said the patient was fine until this morning. The patient was found decreased mental status and also chills, so the patient was sent to the ED for further evaluation. The patient was noted to have an elevated ammonia level at 162. Chest x-ray showed right-sided infiltration. The patient's wife said the patient has a history of liver cirrhosis, [facility]. The patient is supposed to have a colonoscopy this Tuesday. The patient's wife said the patient quit alcohol abuse 8 years ago, she denied that the patient drank alcohol recently.   PAST MEDICAL HISTORY: Liver cirrhosis, alcohol abuse, hypertension, multiple admissions with hepatic encephalopathy, hyperlipidemia, anemia of chronic disease, thrombocytopenia, glaucoma, GERD, diverticulosis, colon polyps, hemorrhoid, allergic rhinitis, type 2 diabetes, hypomagnesemia.   PAST SURGICAL HISTORY: Cholecystectomy, partial colectomy secondary to diverticulitis, vocal cord polypectomy, sinus surgery, hemorrhoid banding, tonsillectomy, left knee arthroscopy  and rotator cuff repair.   SOCIAL HISTORY: Quit smoking, history of alcohol abuse 8 years ago.   FAMILY HISTORY: Unable to obtain at this time but from previous documents the patient has a family history of liver cirrhosis and colorectal cancer.   REVIEW OF SYSTEMS: Unable to obtain due to the patient's altered mental status.   ALLERGIES: None.    HOME MEDICATIONS: 1.  Rifaximin 550 mg p.o. b.i.d. 2.  Proventil HFA CFC-free 90 mcg inhalation aerosol 2 puffs every 6 hours p.r.n.   3.  Theragran therapeutic multivitamin p.o. tablets 1 tab once a day.  4.  Promethazine 25 mg p.o. every 6 hours p.r.n. for nausea, vomiting.  5.  Magnesium oxide 400 mg p.o. tablets 1 tab once a day.  6.  Lexapro 10 mg p.o. at bedtime.  7.  Latanoprost ophthalmic solution and 0.005%, one drop to each eye once a day at bedtime.  8.  Lansoprazole 30 mg p.o. once a day.  9.  Lactulose 10 grams/15 mL oral syrup 30 mL p.o. 4 times a day.  10.  Lasix 20 mg p.o. daily.  11.  Fluticasone nasal 50 mcg inhalation nasal spray 2 sprays each nostril twice a day.  12.  Allegra-D 12-hour 60 mg/120 mg p.o. tablets extended-release 1 tab every 12 hours.  13.  Aldactone 25 mg p.o. once a day.  14.  Advair 500 mcg/50 mcg inhalation powder 1 puff inhaled twice a day.   PHYSICAL EXAMINATION:  VITAL SIGNS:  Blood pressure 106/66, pulse 77, respirations 20, O2 saturation 96% on room air.  GENERAL: The patient is disoriented, noncommunicative, but in no acute distress.  HEENT: Pupils round, equal and reactive to light and accommodation. No discharge from ear or nose. Unable to do oral exam but has epistaxis from the right side nostril.  NECK: Supple. No JVD or carotid bruit. No lymphadenopathy. No thyromegaly.  CARDIOVASCULAR: S1, S2. Regular rate, rhythm. No murmurs or gallops.  PULMONARY: Bilateral air entry. No wheezing or rales. No use of accessory muscle to breathe.  ABDOMEN: Soft. No distention,  but has mild rigidity, difficult to estimate whether patient has organomegaly. Bowel sounds hypoactive.  EXTREMITIES:  No edema, clubbing or cyanosis. No calf tenderness. Strong bilateral pedal pulses.  SKIN: No rash or jaundice.  NEUROLOGIC: The patient is disorientated, nonverbal, unable to examine at this time.   LABORATORY AND RADIOLOGICAL DATA:   1.  ABG showed pH of  7.46, pCO2 34, pO2 of 76.  2.  CAT scan of head without contrast showed no acute intracranial abnormality.  3.  Urinalysis is negative.  4.  Urine toxicology is negative.  5.  Chest x-ray showed increasing density in the right lung base.  6.  CK 47, CK-MB 0.9.  7.  Glucose 105, BUN 21, creatinine 1.34, sodium 137, potassium 5.1, chloride 104, bicarb 28.  8.  Troponin less than 0.02.  9.  INR 1.7.  10.  WBC 6.3, hemoglobin 11, platelets 86.  11.  Alcohol level less than 3.  12.  Ammonia level 162.   IMPRESSION: 1.  Altered mental status due to hepatic encephalopathy.  2.  Liver cirrhosis.  3.  Possible aspiration pneumonia.  4.  Acute renal failure.  5.  Anemia.  6.  Thrombocytopenia.  7.  Hypertension.  8.  Diabetes.   PLAN OF TREATMENT: 1.  The patient will be admitted to medical floor. We will start aspiration and fall precautions. We will keep n.p.o. except medications. Get NG tube.  2.  We will continue lactulose, Rifaximin and follow up ammonia level. We will get a GI consult from Dr. Marva Panda.  3.  For aspiration pneumonia, we will start Zosyn and Levaquin, and follow up CBC, blood culture and sputum culture.  4.  For acute renal failure and dehydration, will give gentle rehydration and follow up BMP.  5.  For diabetes, we will start sliding scale. Check hemoglobin A1c.   6.  I discussed the patient's condition and the plan of treatment with the patient's wife. The patient's CODE STATUS is DO NOT RESUSCITATE.   TIME SPENT: About 55 minutes.    ____________________________ Shaune Pollack, MD qc:cs D: 10/10/2013 15:04:36 ET T: 10/10/2013 15:29:21 ET JOB#: [id number]  cc: Shaune Pollack, MD, <Dictator> Shaune Pollack MD ELECTRONICALLY SIGNED 10/12/2013 11:10

## 2015-03-31 NOTE — Consult Note (Signed)
Brief Consult Note: Diagnosis: ascites.   Patient was seen by consultant.   Consult note dictated.   Comments: Appreciate consult for 59 y/o caucasian man with history of liver cirrhosis/fmr etoh abuse for evaluation of ascites. [facility].  Reason for cirrhosis is unclear: possibly etoh related with NAFLD component: tests for Hep B and C negative, ANA/AMA/ASMA negative. Alpha one antitrypsin normal with normal phenotype, ceruloplasmin and Fe studies have been normal. I am unaware if he has had liver biopsy.  Has had paracentesis today, states 5l removed. Feeling better. This is his 3rd paracentesis in the last few months. [facility].  States that he tries to be adherent to 2gm na diet, but really feels sometimes like he needs the salt. Is currently on Aldactone 100mg  po daily and Lasix 20mg  po daily and states adherence to these. Takes lactulose bid-tid for HE prevention. Most recent NH4 46. Also on Prevacid 30mg  po daily. Has Zofran and Phenergan to use  prn  . No longer on nadolol d/t low bp Does report int nausea, abdominal discomfort, occasional vomiting but less than in months past. States his primary symptom is fatigue/ lethargy. Has frequent falls.  These have been chronic complaints: due to these complaints, he has been assessed for heavy metal accumulation and was negative, did have a positive marijuana screen recently, but negative last Oct. States he is no longer using. States is has been many yrs since his last alcoholic drink. He has [facility] and NV/hypotension. Has had hypokalemia. In chart review, had  low cortisol test last summer- doesnt know if re-evald Impression and plan: cirrrhosis with ascites. Will start SBP proph. Due to his additional complaints, salt desire, and low cortisol, will reassess cortisol and assess ACTH. Before adjusting diuretics, would like 24h urine  Na and K. Still has alcoholic pattern to liver enzymes, although these are stable from prior. Will get CHO deficient transferrin. Will follow. [facility]>.  Electronic Signatures: Vevelyn PatLondon, Roby Donaway H (NP)  (Signed 08-Dec-14 17:58)  Authored: Brief Consult Note   Last Updated: 08-Dec-14 17:58 by Keturah BarreLondon, Tamantha Saline H (NP)

## 2015-03-31 NOTE — Discharge Summary (Signed)
PATIENT NAME:  [name], [name] MR#:  [id number] DATE OF BIRTH:  1956/06/02  DATE OF ADMISSION:  06/24/2013 DATE OF DTish MenSCHARGE:  06/28/2013  PRIMARY CARE PHYSICIAN: Dr. Terance [name].   DISCHARGE DIAGNOSES:  1.  Hepatic encephalopathy.  2.  Noncompliance.  3.  Alcoholic cirrhosis.  4.  Uncontrolled hypertension.  5.  Recurrent falls.  6.  Peripheral neuropathy.  7.  Nausea.  8.  Hypomagnesemia.  9.  Chronic thrombocytopenia.  10.  Anemia of chronic disease.   CONSULTANTS: Physical therapy.   IMAGING STUDIES: Include: 1.  CT scan of the head without contrast showed no acute abnormalities.  2.  KUB x-ray showed no obstruction or ileus.  3.  Chest x-ray, portable view, showed no acute cardiopulmonary abnormalities.   ADMITTING HISTORY AND PHYSICAL: Please see detailed H and P dictated by Dr. Mordecai [name]. In brief, a 59 year old African American male patient with history of alcoholic cirrhosis, alcohol abuse, hepatic encephalopathy, who presented to the hospital with recurrent falls and weakness. The patient was found to have elevated ammonia, admitted to the hospitalist service. The patient has been noncompliant with medications at home which caused elevated ammonia. On restarting his lactulose, the patient had multiple episodes of diarrhea along with fall and pneumonia. He was more awake, back to his baseline. Has ambulated well with physical therapy. He has been recommended home health PT which has been started.   The patient did have elevated high blood pressure which is well controlled. Magnesium started for hypomagnesemia. He did have a mild episode of nausea secondary to his cirrhosis which has resolved.   The patient has been counseled to be compliant with medication and is being discharged home with home health. Prior to discharge, the patient does not have any abdominal tenderness on exam.   DISCHARGE MEDICATIONS: Include: 1.  Lansoprazole 30 mg oral once a day in the morning.  2.   Allegra oral every 12 hours.  3.  Advair Diskus 500/50 inhaled one puff 2 times a day.  4.  Fluticasone 50 mcg in both nostrils.  5.  Lasix 20 mg oral once a day.  6.  Aldactone 25 mg oral once a day.  7.  Proventil HFA 2 puffs every six hours as needed for shortness of breath.  8.  Hydrochlorothiazide lisinopril 12.5 mg/20 oral once a day.  9.  Potassium chloride 20 mEq oral 2 times a day.  10.  Lactulose 30 mL oral four times a day.  11.  Magnesium oxide 400 mg oral once a day.  12. Zofran 4 mg oral 4 times a day as needed for nausea or vomiting.   DISCHARGE INSTRUCTIONS: Low-sodium diet. Activity as tolerated with assistance. Work with physical therapy at home health.   FOLLOWUP: His GI doctor in 1 to 2 weeks and primary care physician in 1 to 2 weeks.   Time spent on day of discharge in discharge activity was 40 minutes.    ____________________________ Calvin BailiffSrikar R. Shae Hinnenkamp, MD srs:np D: 06/28/2013 15:35:40 ET T: 06/28/2013 22:23:30 ET JOB#: [id number]  cc: Calvin HeathSrikar R. Aavya Shafer, MD, <Dictator> Calvin Iraniavid M. Calvin HartBronstein, MD   Orie FishermanSRIKAR R Loi Rennaker MD ELECTRONICALLY SIGNED 07/04/2013 20:57

## 2015-03-31 NOTE — Consult Note (Signed)
Chief Complaint:  Subjective/Chief Complaint Please see full GI consult.  Patient seen and examined, chart reviewed.  Patient admitted with recurrent ascites in hte settign of cirrhosis of the liver, most likely etoh related.  Patietn also being [facility] by Dr Scot JunZachs in hepatology and is undergoing evaluation for possible liver transplant (meld about 16).  Feeling better today after parecentesis of 5 liters. Did not recieve albumin afterwards. Please see consult and brief consult note for full recommendations.   VITAL SIGNS/ANCILLARY NOTES: **Vital Signs.:   08-Dec-14 19:55  Temperature Temperature (F) 98.4  Celsius 36.8  Temperature Source oral  Pulse Pulse 84  Respirations Respirations 20  Systolic BP Systolic BP 108  Diastolic BP (mmHg) Diastolic BP (mmHg) 65  Mean BP 79  Pulse Ox % Pulse Ox % 95  Pulse Ox Activity Level  At rest  Oxygen Delivery Room Air/ 21 %   Electronic Signatures: Barnetta ChapelSkulskie, Jeannia Tatro (MD)  (Signed 08-Dec-14 21:58)  Authored: Chief Complaint, VITAL SIGNS/ANCILLARY NOTES   Last Updated: 08-Dec-14 21:58 by Barnetta ChapelSkulskie, Kaimani Clayson (MD)

## 2015-03-31 NOTE — Op Note (Signed)
PATIENT NAME:  [name], [name] MR#:  [id number] DATE OF BIRTH:  May 12, 1956  DATE OF PROCEDURE:  03/04/2013  PREOPERATIVE DIAGNOSIS: Right inguinal hernia.   POSTOPERATIVE DIAGNOSIS: Right inguinal hernia.  OPERATION: Robotic-assisted hernia repair.   SURGEON: Quentin Orealph L. Ely, III, MD   OPERATIVE PROCEDURE: With the patient in the supine position after the induction of appropriate general anesthesia, the patient's abdomen was prepped with ChloraPrep and draped with sterile towels. A supraumbilical incision was made in the standard fashion and carried down bluntly through the subcutaneous tissue. The Visiport apparatus was used to cannulate the peritoneal cavity because of the patient's known previous surgery. Cannulation was achieved without difficulty. The abdomen was then insufflated.  There was a significant amount of ascites which was not removed. There was evidence of cirrhosis and portal hypertension. Two lateral ports were placed in the standard fashion under direct vision and the assistant port placed in the right upper quadrant, a 5 mm port in size. The patient was then placed in Trendelenburg position and then docked to the robot. Dissection was carried out in the right lower quadrant. The peritoneum was taken down without difficulty and the sac dissected from the cord structures without difficulty. Cooper's ligament was identified. A window was created behind the cord structures. Atrium ProLite mesh was brought to the table, appropriately fashioned, inserted through the umbilical port and placed into the preperitoneal space. It was arranged behind the cord structures overlapping on Cooper's ligament. The mesh placement appeared to be satisfactory. I attempted to sew the mesh in place but had difficulty tying the sutures. I kept cutting the suture or breaking the suture inadvertently.  For that reason, I abandoned the sutures, tacked the mesh in place, then tacked the peritoneum back over the mesh.  The robot was then undocked and the abdomen desufflated. The midline fascia was closed with figure-of-eight suture of 0 Vicryl. The skin was closed with 5-0 nylon. The area was infiltrated with 0.25% Marcaine for postoperative pain control. Sterile dressings were applied.       The patient returned to the recovery room having tolerated the procedure well. Sponge, instrument and needle counts were correct x2 in the operating room.  ____________________________ Quentin Orealph L. Ely III, MD rle:cb D: 03/04/2013 14:54:42 ET T: 03/04/2013 15:20:34 ET JOB#: [id number]  cc: Quentin Orealph L. Ely III, MD, <Dictator> Quentin OreALPH L ELY MD ELECTRONICALLY SIGNED 03/04/2013 18:56

## 2015-03-31 NOTE — H&P (Signed)
PATIENT NAME:  Calvin [name], Calvin [name] MR#:  [id number] DATE OF BIRTH:  March 16, 1956  DATE OF ADMISSION:  12/02/2013  PRIMARY CARE PHYSICIAN:  Dr. Lovie Macadamia.   CHIEF COMPLAINT:  Shortness of breath.   HISTORY OF PRESENT ILLNESS:  A 59 year old male with a history of known ascites, liver cirrhosis secondary to former alcohol abuse, multiple admissions for ascites who presents with the above complaint.  Over the past few days, the patient has had increasing right-sided pain as well as shortness of breath.  A chest x-ray was performed here which shows basically a whiteout of his right lung.  He was actually here early in December just about two weeks ago where he had shortness of breath and ascites and had a status post paracentesis.  He says that recently his Lasix was increased to 80 mg daily.   REVIEW OF SYSTEMS:  CONSTITUTIONAL:  No fever.  Positive fatigue, weakness.  EYES:  No blurred or double vision, glaucoma or cataracts.  EARS, NOSE, THROAT:  No ear pain, hearing loss, snoring, postnasal drip.  RESPIRATORY:  No cough, wheezing.  Positive shortness of breath.  Positive dyspnea.  CARDIOVASCULAR:  No chest pain, palpitations, orthopnea, syncope, edema, arrhythmia, dyspnea on exertion.  GASTROINTESTINAL:  No nausea, vomiting, diarrhea.  Positive chronic abdominal pain.  No hematemesis, melena or ulcers.  GENITOURINARY:  No dysuria or hematuria.  ENDOCRINE:  No polyuria or polydipsia.  HEMATOLOGIC AND LYMPHATIC:  No anemia or easy bruising.  SKIN:  No rash or lesions.  MUSCULOSKELETAL:  No limited activity. NEUROLOGIC:  No history of CVA, TIA. PSYCHIATRIC:  No history of ADD, OCD.   PAST MEDICAL HISTORY: 1.  Liver cirrhosis secondary to alcohol abuse.  The patient is followed at [facility] as well as Dr. Gustavo Lah.   2.  Former alcohol abuse, quit 10 years ago.  3.  Hypertension.  4.  Hyperlipidemia.  5.  Anemia of chronic disease.  6.  Thrombocytopenia.  7.  Glaucoma.  8.  GERD.  9.  Type 2  diabetes.   PAST SURGICAL HISTORY: 1.  Cholecystectomy.  2.  Partial colectomy.  3.  Vocal cord polypectomy.  4.  Sinus surgery.  5.  Hemorrhoid banding.   6.  Tonsillectomy.  7.  Left knee arthroscopy.  8.  Rotator cuff repair.   SOCIAL HISTORY:  The patient quit smoking, quit drinking about 10 years ago.  No IV drug use.   FAMILY HISTORY:  Positive for liver cirrhosis and colorectal cancer.   MEDICATIONS: 1.  Aldactone 100 mg daily.  2.  Lasix 20 mg daily, but reported increased to 80 mg daily.  3.  Advair 250/50 twice daily.  4.  Lisinopril 10 mg daily.  5.  Lactulose 30 mg 4 times a day.  6.  Rifaximin 550 mg twice daily.  7.  Latanoprost 0.05% ophthalmic drop both eyes at bedtime.  8.  Lansoprazole 30 mg daily.  9.  Allegra one tablet q. 12 hours.  10.  Multivitamin 1 tablet daily.  11.  Lexapro 10 mg daily. 12.  Ciprofloxacin 750 mg q. 7 days.  PHYSICAL EXAMINATION: VITAL SIGNS:  Temperature 99, pulse is 104, respirations 20, blood pressure 100/68, 98% on room air.  GENERAL:  The patient is alert, oriented, not in acute distress.  HEENT:  Head is atraumatic.  Pupils are round and reactive.  Sclerae are anicteric.  Mucous membranes are moist.  Oropharynx is clear.  NECK:  Supple without JVD, carotid bruit or enlarged thyroid  CARDIOVASCULAR:  Tachycardia.  No murmurs, gallops, or rubs.  PMI is laterally displaced.  LUNGS:  He has got basically no breath sounds on the right side with dullness to percussion all the way up the whole lung field.  No crackles or rales are heard.  No egophony.  ABDOMEN:  Bowel sounds are positive.  Nontender, nondistended.  No hepatosplenomegaly.  EXTREMITIES:  No clubbing, cyanosis or edema.   NEUROLOGIC:  Cranial nerves II through XII are grossly intact.  No focal deficits.  MUSCULOSKELETAL:  Five out of five strength in all extremities.  No pathology to digits or nails.  SKIN:  Without rash or lesions.   LABORATORY AND RADIOLOGICAL DATA:   Ammonia 29.  Troponin less than 0.02.  White blood cells 12.9, hemoglobin 12, hematocrit 35, platelets are 127, sodium 126, potassium 2.9, chloride 90, bicarb 29, BUN 14, creatinine 1.32.  Glucose is 237.  BNP 111.  CK 43, CPK-MB 0.7, lipase 527, INR is 1.9, bilirubin 3.6, direct is 2.0, alk phos 162, ALT 39, AST 51, total protein 6.1, albumin 2.3.  EKG:  Sinus tachycardia.  No ST elevations or depressions.   Chest x-ray, recurrent large right pleural effusion with complete opacification and a right hemothorax.   ASSESSMENT AND PLAN:  A 59 year old male with known liver cirrhosis secondary to alcohol abuse, multiple admissions for ascites who presents for shortness of breath, found to have complete opacification of the right hemithorax.   1.  Large right pleural effusion secondary to liver cirrhosis.  The patient has known pleural effusions in the past.  He will need an ultrasound-guided thoracentesis.  I have ordered that.  I also will continue his Lasix and Aldactone.  Ask gastroenterology to see the patient in consultation.  Further recommendations per gastroenterology consult.   Lives services will continue his outpatient medications.  2.  Glaucoma.  Continue his eyedrops.  3.  Hypokalemia.  We will replete and check magnesium level.  4.  Hyponatremia.  I suspect from liver cirrhosis and his large right pleural effusion.  The patient is on Lasix.  We will continue to monitor.  5.  Acute renal failure.  His creatinine is 1.32.  It could be secondary to liver cirrhosis.  He is on Aldactone and Lasix.  I will go ahead and consult renal as well because we do not want this patient to go into hepatorenal syndrome.  It is noted that his blood pressure is slightly low as well, so we will hold any hypertensive medications.   CODE STATUS:  THE PATIENT IS A FULL CODE STATUS.    TIME SPENT:  Approximately 50 minutes.    ____________________________ Donell Beers. Benjie Karvonen, MD spm:ea D: 12/02/2013 01:23:00  ET T: 12/02/2013 04:09:04 ET JOB#: [id number]  cc: Igor Bishop P. Benjie Karvonen, MD, <Dictator> Youlanda Roys. Lovie Macadamia, MD Donell Beers Shawndrea Rutkowski MD ELECTRONICALLY SIGNED 12/03/2013 0:04

## 2015-03-31 NOTE — Consult Note (Signed)
Brief Consult Note: Diagnosis: hepatic hydrothorax.   Patient was seen by consultant.   Consult note dictated.   Recommend to proceed with surgery or procedure.   Comments: Recurrent hepatic hydrothorax.  Agree with thoracentesis for fluid removal, but do not leave catheter in place.   If this reoccurs again,  [facility].  Doubt diuretics will be enough.   [facility].  Given need for TIPS and transplant, [facility].  Electronic Signatures: Dow Adolphein, Adreona Brand (MD)  (Signed 25-Dec-14 15:41)  Authored: Brief Consult Note   Last Updated: 25-Dec-14 15:41 by Dow Adolphein, Tavionna Grout (MD)

## 2015-03-31 NOTE — H&P (Signed)
PATIENT NAME:  Calvin [name], Calvin [name] MR#:  [id number] DATE OF BIRTH:  1956-04-27  DATE OF ADMISSION:  11/15/2013  REFERRING PHYSICIAN: Dr. Lucrezia Europe.   PRIMARY CARE PHYSICIAN: Dr. Terance Hart.   PRIMARY GASTROENTEROLOGIST: Dr. Marva Panda.   CHIEF COMPLAINT: "I think need my fluid in my abdomen drained, as is getting more distended."   HISTORY OF PRESENT ILLNESS: This is a 59 year old male with a past medical history of liver cirrhosis and alcohol abuse in the past, with multiple hospitalizations for his ascites and hepatic encephalopathy. The patient presents with worsening abdominal distention due to  worsening ascites. The patient reports he was at [facility] before two weeks, where he had his fluid drained; however, he reports he had temporary relief, but reports he has been having recurrent worsening of his ascites, and he comes here for paracentesis. The patient reports he has been compliant with his medications. Denies any alcohol abuse currently. Denies any fever or any chills. Reports his abdominal pain is mainly due to distension. The patient's x-ray did show right pleural effusion, which is known, where the patient had thoracocentesis done here in November, where his pleural effusion is due to hepatic origin. The patient has been complaining of mild shortness of breath, which he reports is his baseline. Hospitalist service was requested to admit the patient for further work-up and for the need of paracentesis. The patient's labs were significant for hypokalemia at 3 and known chronic anemia at 10.6, thrombocytopenia at 117, which is better than his baseline with mildly coagulopathy, with INR of 1.5.   PAST MEDICAL HISTORY: 1.  Liver cirrhosis.  2.  Alcohol abuse.  3.  Hypertension.  4.  Multiple admissions with hepatic encephalopathy and/or ascites. 5.  Hyperlipidemia.  6.  Anemia of chronic disease.  7.  Thrombocytopenia.  8.  Glaucoma.  9.   Gastroesophageal reflux disease.  10.   Diverticulosis.  11.  Colon polyp.  12.  Hemorrhoids.  13.  Allergic rhinitis.  14.  Type 2 diabetes.   15.  Hypomagnesemia.   PAST SURGICAL HISTORY:  1.  Cholecystectomy.  2.  Partial colectomy.  3.  Vocal cord polypectomy.  4.  Sinus surgery. 5.  Hemorrhoid banding.  6.  Tonsillectomy.  7.  Left knee arthroscopy and rotator cuff repair.    SOCIAL HISTORY: Quit smoking. History of alcohol abuse; he quit eight years ago.   FAMILY HISTORY: The patient has family history of liver cirrhosis and colorectal cancer.   REVIEW OF SYSTEMS:  CONSTITUTIONAL: The patient denies fever, chills, fatigue, weakness. Reports weight gain.  EYES: Denies blurry vision, double vision, inflammation. Reports history of glaucoma.  ENT: Denies tinnitus, ear pain, hearing loss or epistaxis.  RESPIRATORY: Denies cough, wheezing, hemoptysis. Reports chronic dyspnea.  CARDIOVASCULAR: Denies chest pain, orthopnea, arrhythmia or palpitations.  GASTROINTESTINAL: Denies nausea, vomiting, diarrhea, constipation. Complains of worsening abdominal distention and abdominal pain.  GENITOURINARY: Denies dysuria, hematuria, renal colic.  ENDOCRINE: Denies polyuria, polydipsia, heat or cold intolerance.  HEMATOLOGY: Denies easy bruising, bleeding diathesis or history of blood clots.  INTEGUMENTARY: Denies any acne, rash. Complains of itching.  MUSCULOSKELETAL: Denies any gout, arthritis or cramps.  NEUROLOGIC: Denies CVA, transient ischemic attack, headache, ataxia, vertigo.  PSYCHIATRIC: Denies anxiety, insomnia, bipolar disorder or schizophrenia.   ALLERGIES: NO KNOWN DRUG ALLERGIES.   HOME MEDICATIONS: 1.  Aldactone 100 mg oral daily.  2.  Lasix 20 mg oral daily, but reports he had recently increased it to 60 mg oral daily.  3.  Advair 230/21, two puffs 2 times a day.  4.  Lisinopril 10 mg oral daily.  5.  Zofran as needed.  6.  Promethazine as needed.  7.  Lactulose 30 mg 4 times a day.  8.  Rifaximin 550 mg  oral 2 times a day.  9.   Magnesium oxide 400 mg oral daily.  10.  Latanoprost 0.005% ophthalmic drop, both eyes at bedtime.  11.  Lansoprazole 30 mg oral daily.  12.  Allegra daily every 12 hours.  13.  Multivitamin 1 tablet oral daily.    PHYSICAL EXAMINATION: VITAL SIGNS: Temperature 98.7, pulse 90, respiratory rate 16, saturating 96% on room air, blood pressure 124/71.  GENERAL: Well-nourished male who looks resting comfortably bed, in no apparent distress.  HEENT: Head atraumatic, normocephalic. Pupils equal, reactive to light. Anicteric sclerae. Pink conjunctivae.  NECK: Supple. No thyromegaly. No JVD.  CHEST: Good air entry bilaterally, which is mildly diminished on the right side. No wheezing or rhonchi.  CARDIOVASCULAR: S1, S2 heard. No rubs, murmur or gallop.  ABDOMEN: Distended, with fluid wave but soft and nontender to palpation. Bowel sounds present. The patient's hernia site at the right groin was examined. No erythema. No masses felt.  EXTREMITIES: +2 edema bilaterally. Pulses +2 bilaterally in distal pulses and radial.  SKIN: Dry. Has itching marks.  LYMPHATICS: No cervical lymphadenopathy could be appreciated.  NEUROLOGIC: Cranial nerves grossly intact. Motor 5/5. No focal deficits.  PSYCHIATRIC: Appropriate affect. Awake, alert x3. Intact judgment and insight.   PERTINENT LABORATORY DATA: Glucose 120, BUN 15, creatinine 1.02, sodium 136, potassium 3, chloride 101, total protein 5.6, albumin 2.1, total bilirubin 3.2, alkaline phosphatase 152, AST 45, ALT 33. Troponin less than 0.02. White blood cell 8.2, hemoglobin 10.6, hematocrit 31, platelets 117, INR 1.5   ASSESSMENT AND PLAN: 1.  Worsening ascites. This is due to the patient's alcoholic liver cirrhosis. It appears the patient will need therapeutic tap in a.m., so we will consult interventional radiology for ultrasound-guided paracentesis. At this point, the patient does not appear to have any spontaneous bacterial  peritonitis, so no need for any antibiotics. The patient is afebrile, has no abdominal pain on the physical exam. He will be continued on the Lasix and Aldactone.  2.  Hypokalemia. We will replace.  3.  Liver cirrhosis. Will continue the patient on lactulose and rifaximin.  4.  Large right-sided pleural effusion. This is likely from hepatic origin, status post thoracentesis, last admission with recurrence. Does not cause any significant respiratory compromise. Will recur after thoracentesis, so at this point, there is no need for another thoracentesis.  5.  Pain at previous hernia repair site. We will consult surgery, but site looks benign.  6.  Thrombocytopenia appears to be improving. This is due to his alcoholic cirrhosis. 7.  Elevated liver function tests, improving as well and this is related to his alcoholic cirrhosis.  8.  Deep vein thrombosis prophylaxis. For the time being, we will have the patient on sequential compression devices. We will hold on starting subcutaneous heparin, as the patient will need paracentesis in a.m., so we have to hold all blood thinners, and if patient will have lengthy hospital stay after paracentesis, he can be on subcutaneous heparin.  9.  Gastrointestinal prophylaxis: The patient will be on Protonix.   Code status: patient report he is DNR  TOTAL TIME SPENT ON ADMISSION AND PATIENT CARE: 50 minutes.     ____________________________ Starleen Arms, MD dse:cg D: 11/15/2013 01:33:38 ET  T: 11/15/2013 01:53:35 ET JOB#: [id number]  cc: Starleen Armsawood S. Julieanna Geraci, MD, <Dictator> Avraj Lindroth Teena IraniS Trey Gulbranson MD ELECTRONICALLY SIGNED 11/15/2013 4:11

## 2015-03-31 NOTE — Consult Note (Signed)
Brief Consult Note: Diagnosis: hepatic encephalopathy, history of cirrhosis.   Patient was seen by consultant.   Consult note dictated.   Recommend further assessment or treatment.   Comments: Please see full GI consult [phone]#[id number].  Patient known to me although he has not been to GI o/p clinic for follow up for some time.  Presenting with AMS in the setting of cirrhosis and hyperammonemia. No mass noted in liver though ultrasound c/w cirrhosis.  Non-compliant with medications.  Continue mild diuresis, suggest continueing lasix 20-40 mg po daily adding aldactone 25 mg daily initially to be adjusted as outpatient.  Continue nadolol as variceal bleeding prophlaxis 20-40 mg q 5pm.  continue lactulose 30 mg po bid as outpatient, stressing compliance.  I will happy to see [name] in clinic as outpatient show he decide to follow up.  Electronic Signatures: Barnetta ChapelSkulskie, Prima Rayner (MD)  (Signed 24-May-14 17:06)  Authored: Brief Consult Note   Last Updated: 24-May-14 17:06 by Barnetta ChapelSkulskie, Edward Trevino (MD)

## 2015-03-31 NOTE — Discharge Summary (Signed)
PATIENT NAME:  Calvin [name], Calvin [name] MR#:  [id number] DATE OF BIRTH:  05-Jun-1956  DATE OF ADMISSION:  04/30/2013 DATE OF DISCHARGE:  05/02/2013  PRIMARY CARE PHYSICIAN: Dr. Terance HartBronstein.   CONSULTATION: Dr. Marva PandaSkulskie.   FINAL DIAGNOSES:  1. Hepatic encephalopathy.  2. Cirrhosis.  3. Hypertension.  4. Hypomagnesemia.  5. Hypokalemia.  6. Noncompliance.   CODE STATUS. FULL CODE.    CONDITION: Stable.   HOME MEDICATIONS: [facility].   NEW MEDICATIONS: Aldactone 25 mg p.o. daily, nadolol 40 mg p.o. daily, magnesium oxide 400 mg p.o. once a day for 10 days, Lasix 20 mg p.o. daily.   Continue other home medications.   The patient needs home health PT with home health nurse. In addition, the patient needs a Child psychotherapistsocial worker due to noncompliance.   DIET: Low-sodium, low-fat, low-cholesterol diet.   ACTIVITY: As tolerated.   FOLLOWUP CARE: Follow up with GI, Dr. Marva PandaSkulskie, within 1 week. Follow up with PCP within 1 to 2 weeks.   REASON FOR ADMISSION: Lethargy.   HOSPITAL COURSE: The patient is a 59 year old Caucasian male with a history of cirrhosis on lactulose, noncompliant with medication. Was brought to the ED due to altered mental status and lethargy. The patient was noted to have a high ammonia level at 120. For detailed history and physical examination, please refer to the admission note dictated by Dr. Jacques NavyAhmadzia. Laboratory data on admission date showed glucose 121, BUN 13, creatinine 0.98. Electrolytes: Sodium 143, potassium 3.3. Ammonia level 122. Bilirubin 3.5 which is about baseline. Albumin 2.3. WBC 4.8, hemoglobin 9.4. INR 1.6. The patient was admitted for altered mental status with hepatitic encephalopathy. After admission, the patient has been treated with lactulose 30 mg t.i.d. In addition, the patient has been treated with Lasix 20 mg daily for some ascites and lower extremity edema. Dr. Marva PandaSkulskie evaluated the  patient and suggested continuing Lasix, Aldactone and continue nadolol and follow up with him as an outpatient. The patient's mental status improved since yesterday. He is alert, awake, oriented. No complaints. Ammonia level decreased to the 70s. His baseline ammonia level was 54. The patient also had hypomagnesemia for which the patient was given magnesium supplement. The patient also had hypokalemia. He was treated with potassium supplement. Hypokalemia improved. The patient also has anemia and thrombocytopenia which is chronic, also related to liver cirrhosis.   The patient is clinically stable. Will be discharged to home with home health and PT and social worker. I discussed the patient's discharge plan with the patient's wife, brother, case Production designer, theatre/television/filmmanager and nurse.   TIME SPENT: About 45 minutes.   ____________________________ Shaune PollackQing Ines Warf, MD qc:gb D: 05/02/2013 18:10:49 ET T: 05/03/2013 00:11:15 ET JOB#: [id number]  cc: Shaune PollackQing Geonna Lockyer, MD, <Dictator> Shaune PollackQING Katarina Riebe MD ELECTRONICALLY SIGNED 05/04/2013 17:07

## 2015-03-31 NOTE — H&P (Signed)
PATIENT NAME:  Calvin [name], Calvin [name] MR#:  [id number] DATE OF BIRTH:  04-16-1956  ADDENDUM  DATE OF ADMISSION:  04/30/2013  SOCIAL HISTORY: The patient does not smoke tobacco, but occasionally uses dip. No drug use. No alcohol for 8 years, but before that he used to drink alcohol. 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    ____________________________ Krystal EatonShayiq Frederic Tones, MD sa:mr D: 04/30/2013 21:23:00 ET T: 04/30/2013 21:49:12 ET JOB#: [id number]  cc: Krystal EatonShayiq Darcell Yacoub, MD, <Dictator> Krystal EatonSHAYIQ Rees Santistevan MD ELECTRONICALLY SIGNED 05/11/2013 12:40

## 2015-03-31 NOTE — Consult Note (Signed)
PATIENT NAME:  Calvin [name], Calvin [name] MR#:  [id number] DATE OF BIRTH:  07-03-1956  DATE OF CONSULTATION:  10/13/2013  REQUESTING PHYSICIAN: Dr. Delfino LovettVipul Shah.  CONSULTING PHYSICIAN: Jasmine Decemberimothy [name] Daisja Kessinger, M.D.   REASON FOR CONSULTATION: Right pleural effusion.   HISTORY: I have personally seen and examined Mr. Calvin [name]. I have discussed his care with Dr. Sherryll [name].   This is a 59 year old gentleman with a history of alcoholic cirrhosis who presented to the Emergency Department with decreased mental status. When he was admitted to the hospital he was disoriented and noncommunicative, unable to provide much information. The patient was found to have an ammonia level of 162 upon admission, and a chest x-ray showed a right-sided pleural effusion. The [facility] and was supposed to undergo colonoscopy. The patient's wife states that he quit drinking approximately eight years ago.   PAST MEDICAL HISTORY: Significant for alcoholic cirrhosis. He also has a history of _____  encephalopathy, hyperlipidemia, anemia, thrombocytopenia, glaucoma, reflux, colonic polyps, hemorrhoids, and type 2 diabetes.   PAST SURGICAL HISTORY: As per the chart his past surgical history consists of gallbladder, partial colectomy due to diverticulitis. Vocal cord polypectomy, hemorrhoidectomy, tonsillectomy, knee arthroscopy and a rotator cuff repair.   PHYSICAL EXAMINATION:  GENERAL: Today on physical exam he is a thin man in no distress.  LUNGS: He has equal breath sounds after his thoracentesis.  HEART: His heart was regular without murmurs.  EXTREMITIES: No clubbing, cyanosis, or edema.  SKIN: He did not appear jaundiced. He had a Band-Aid over his thoracentesis site which was dry.   I have independently reviewed the patient's x-rays. I do think that he has a new right-sided pleural effusion. I believe that we should obtain a thoracentesis and analyze this for possible hepatic origin  for his pleural effusion. I would not recommend chest tube insertion at this time. I believe that we should attempt to try to get [facility] to see if indeed he is on transplant list there and how we might best facilitate his care.   I will continue to follow the patient with you while he is an inpatient. If there anything further I can do please do not hesitate to call.  ____________________________ Sheppard Plumberimothy [name]. Thelma Bargeaks, MD teo:sg D: 10/13/2013 15:03:47 ET T: 10/13/2013 15:22:23 ET JOB#: [id number]  cc: Marcial Pacasimothy [name]. Thelma Bargeaks, MD, <Dictator> Jasmine DecemberIMOTHY [name] Wildon Cuevas MD ELECTRONICALLY SIGNED 11/08/2013 5:21

## 2015-03-31 NOTE — Consult Note (Signed)
PATIENT NAME:  Calvin [name], BERTI MR#:  [id number] DATE OF BIRTH:  [birth year]  DATE OF CONSULTATION:  11/15/2013  CONSULTING PHYSICIAN:  Harrell Gave A. Zeplin Aleshire, MD  CHIEF COMPLAINT:   Right groin pain x 1 day.   HISTORY OF PRESENT ILLNESS: Mr. Lacek is a pleasant 59 year old male with history of cirrhosis and alcohol abuse who had had a laparoscopic right inguinal hernia repair with Dr. Pat Patrick earlier this year, who had been doing fine. He presents with worsening abdominal distention. He has previously been at [facility]  where he had a paracentesis. He presents again with needs for paracentesis and was admitted for that. He did say that over the last day he has right groin pain, obvious bulge. This is the first time he has had that since his surgery.  No fevers, chills, night sweats, shortness of breath, cough, chest pain, nausea, vomiting, diarrhea, constipation, dysuria or hematuria.   PAST MEDICAL HISTORY: 1.  Liver cirrhosis.  2.  Alcohol abuse.  3.  Hypertension.  4.  History of hepatic encephalopathy and ascites.  5.  History of inguinal hernia status post robot-assisted laparoscopic repair.  6.  Hyperlipidemia.  7.  Anemia.  8.  Thrombocytopenia.  9.  Glaucoma.  10.  Gastroesophageal reflux disease.  11.  Diverticulosis.  12.  Colon polyp.  13.  Hemorrhoids.  14.  Allergic rhinitis.  15.  Type 2 diabetes.  16.  Hypomagnesemia.    17.  History of cholecystectomy.  18.  History colectomy.  19.  History of vocal cord polypectomy.  20.  Sinus surgery.  21.  Hemorrhoid banding.  22.  Tonsillectomy.  23.  Left knee arthroscopy.      SOCIAL HISTORY: Denies current alcohol or drug use.   FAMILY HISTORY: History of cirrhosis, colorectal cancer in family.   REVIEW OF SYSTEMS:  Twelve-point review of systems was obtained. Pertinent positives and negatives as above.   PHYSICAL EXAMINATION: VITAL SIGNS: Temperature 97.9, pulse 81, blood pressure 90/47, respirations 18.  GENERAL: No  acute distress. Alert and oriented x 3.  HEAD: Normocephalic, atraumatic.  EYES: No scleral icterus. No conjunctivitis.  FACE: No obvious facial trauma. Normal external nose. Normal external ears.  CHEST: Lungs clear to auscultation. Moving air well.  HEART: Regular rate and rhythm. No murmurs, rubs or gallops.  ABDOMEN: Soft, nontender, nondistended.  GROIN:  No obvious tenderness to right groin or left groin. No obvious hernias.  Incision is clean, dry and intact.  EXTREMITIES: Moves all extremities well. Strength 5/5.  NEUROLOGIC: Cranial nerves II through XII grossly intact.   LABORATORY DATA: As follows: White blood cell count 8.2. Bilirubin of 3.2, alk phos of 152, AST of 45, ALT of 33, potassium 3.0.   ASSESSMENT AND PLAN: Mr. Hellstrom is a pleasant 59 year old male who presented for paracentesis, who has right groin tenderness. I do not feel any obvious hernia. There is definitely  incarcerated hernia. There is no surgical intervention needed at this time and would defer in context of the patient's of cirrhosis and ascites.  No obvious surgical issues. We will sign off. Please call with questions.   ____________________________ Glena Norfolk Kewana Sanon, MD cal:dmm D: 11/15/2013 12:26:42 ET T: 11/15/2013 13:16:11 ET JOB#: [id number]  cc: Harrell Gave A. Kemond Amorin, MD, <Dictator> Floyde Parkins MD ELECTRONICALLY SIGNED 11/18/2013 11:40

## 2015-03-31 NOTE — Consult Note (Signed)
PATIENT NAME:  Calvin [name], Calvin [name] MR#:  [id number] DATE OF BIRTH:  02-20-1956  DATE OF CONSULTATION:  10/10/2013  CONSULTING PHYSICIAN:  Scot Junobert T. Khaila Velarde, MD  REPORT OF CONSULTATION: The patient is a 59 year old white male with a history of liver cirrhosis and alcohol abuse, sent to the ED due to decreased mental status. The patient was noncommunicative and disoriented. In the ED, the patient was noted to have elevated ammonia of 162 and a right-sided infiltrate on chest x-ray. A previous chest x-ray on 06/24/2013 showed no evidence of an infiltrate. The patient was scheduled for colonoscopy this Tuesday.   PAST MEDICAL HISTORY: Alcohol abuse, per wife, patient quit drinking 8 years ago. Hypertension, hepatic encephalopathy, hyperlipidemia, anemia of chronic disease, thrombocytopenia secondary to cirrhosis, GERD, diverticulosis with previous surgery, type 2 diabetes, hypomagnesemia.   PAST SURGICAL HISTORY: Cholecystectomy, partial colectomy due to diverticulitis, vocal cord polypectomy, sinus surgery, tonsillectomy, rotator cuff repair, left knee arthroscopy.   FAMILY HISTORY: Unable to obtain at this time. Previous documents state a family history of cirrhosis and colorectal cancer.   REVIEW OF SYSTEMS:  Unable to obtain. The patient is obtunded, breathing on his own, but noncommunicative.   ALLERGIES: No known drug allergies.   HOME MEDICATIONS:  Rifaximin 550 mg b.i.d. Proventil HFA 2 puffs every 6 hours p.r.n. Theragran multivitamins 1 a day. Promethazine 25 mg every 6 hours p.r.n. Magnesium oxide 400 mg 1 tablet a day. Lexapro 10 mg at bedtime. Latanoprost ophthalmic solution 1 drop each eye once a day at bedtime. Lansoprazole 30 mg once a day. Lactulose 30 mL 4 times a day supposedly. Lasix 20 mg daily. Fluticasone 50 mcg 2 sprays each nostril twice a day. Allegra-D 1 tablet every 12 hours. Aldactone 25 mg daily. Advair 500/50, 1 puff twice a day.   PHYSICAL EXAMINATION: GENERAL: A very  lethargic white male, deeply asleep. The patient is noncommunicative. VITAL SIGNS:  Blood pressure is 94/60, temp 97.5, pulse 76, oxygen sat 96% on room air.  HEENT:  With opening of both eyelids, the eyes seem to both move laterally at times. At times one does and the other one does not. No discharge from ears or nose. Unable to do oral exam.  NECK:  Supple. No JVD. No lymphadenopathy.  HEART:  Regular rate and rhythm.  CHEST: Decreased air flow in the right base, with a different percussion note compared to the rest of the lung and to the left base.  ABDOMEN: Somewhat difficult to examine. No palpable or percussible organomegaly. Bowel sounds are diminished.  EXTREMITIES:  No edema.  SKIN:  No rash or jaundice.   LAB DATA:  Glucose 105, BUN 21, creatinine 1.34, sodium 137, potassium 5.1, chloride 104, bicarb 28. CPK 47, CPK-MB 0.9. Chest x-ray showed increased density in the right lung base. Urine toxicology negative. CT of the head without contrast showed no acute intracranial abnormality. Arterial blood gas:  pH 7.46, pCO2 of 34, pO2 of 76. White count 6.3, hemoglobin 11, platelet count 86,000. Ammonia level 162. INR 1.7.   ASSESSMENT: Altered mental status. I agree altered mental status due to hepatic encephalopathy secondary to cirrhosis of the liver secondary to alcohol abuse. There is a new infiltrate in the right lower lung, possible aspiration pneumonia. There is  thrombocytopenia consistent with cirrhosis. Mild anemia due to uncertain causes, and a history of diabetes.   RECOMMENDATIONS:  Consider either rectal or NG tube  If NG tube is inserted, should be removed as soon as possible because  of the possibility of esophageal varices in a patient with cirrhosis. I would recommend significant hydration. No indication at this time for endoscopic evaluation.   We will follow with you.     ____________________________ Scot Jun, MD rte:mr D: 10/10/2013 17:56:06 ET T: 10/10/2013  18:12:14 ET JOB#: [id number]  cc: Scot Jun, MD, <Dictator> Teena Irani. Terance Hart, MD Christena Deem, MD   Scot Jun MD ELECTRONICALLY SIGNED 11/09/2013 15:02

## 2015-03-31 NOTE — H&P (Signed)
PATIENT NAME:  Calvin [name], Calvin [name] MR#:  [id number] DATE OF BIRTH:  08/16/1956  DATE OF ADMISSION:  04/30/2013  REFERRING PHYSICIAN: Enedina Finnerandolph N. Manson PasseyBrown, MD   PRIMARY CARE PHYSICIAN: Teena IraniDavid M. Terance HartBronstein, MD  CHIEF COMPLAINT: Brought in by wife for lethargy.   HISTORY OF PRESENT ILLNESS: The patient is a 59 year old Caucasian male with a history of cirrhosis, who has not seen GI in many years, on lactulose, apparently noncompliant with it, who presents with altered mental status. The patient is accompanied by his wife, who provides most of the history. However, the patient is able to provide some history as well. Apparently, the patient's cirrhosis was diagnosed about 8 years ago. He saw GI at that time, but has been noncompliant with followup with them. Apparently, per wife, the patient has a brother and sister with cirrhosis and also on the father's side with some family members with cirrhosis of unknown etiology. A possible chemical exposure prior was thought to contribute per wife. However, in the last month or 6 weeks, the patient has had increased lethargy. Per wife, he sleeps about 23 hours out of the day. Recently, an ammonia level was checked with Dr. Terance HartBronstein and his lactulose was increased. However, the patient has not been taking it as prescribed. He appears to be lethargic and the ammonia level was at 122, and hospitalist services were contacted for further evaluation and management. Of note, his previous ammonia level was 54 on April 14.   PAST MEDICAL HISTORY:  1.  Cirrhosis. 2.  Hypertension. 3.  Diverticulitis and diverticulosis in the past. 4.  Allergic rhinitis. 5.  History of colonic polyp. 6.  History of several sinus surgeries.  7.  Hepatic encephalopathy.  8.  Hernia repair.  9.  History of descending colon removal.  10.  GERD.    FAMILY HISTORY: Positive for colorectal cancer as well as cirrhosis as above.   ALLERGIES: No known drug allergies.   OUTPATIENT MEDICATIONS:  Advair 500/50 mcg inhaled 2 times a day, Allegra-D 12-hour 60 mg to 120 mg oral tablet extended-release 1 tab every 12 hours, fluticasone 50 mcg 2 sprays 2 times a day in each nostril, hydrochlorothiazide/lisinopril 12.5/20 mg 1 tab 2 times a day, Klor-Con 20 mEq 1 cap 2 times a day up to 4 times a day as needed, lactulose 15 mL 3 times a day, lansoprazole 30 mg extended-release 1 cap daily, latanoprost ophthalmic 0.005% 1 drop in each eye at bedtime, nadolol 80 mg 2 times a day, Proventil HFA p.r.n.   REVIEW OF SYSTEMS: Unable to fully obtain secondary to altered mental status but per wife, he has lethargy, not eating much, sleepy. The patient denies having any abdominal pain, chest pains or shortness of breath.   PHYSICAL EXAMINATION: VITAL SIGNS: Temperature on arrival 98.1, pulse rate 65, respiratory rate 16, blood pressure 101/55, last blood pressure 116/67, oxygen saturation 99% on room air on arrival.  GENERAL: The patient is a well-developed Caucasian male lying in bed in no obvious distress, lethargic-appearing. HEENT: Normocephalic, atraumatic. Pupils are equal and reactive. There are icteric sclerae. Extraocular muscles intact. Dry mucous membranes.  NECK: Supple. No thyroid tenderness. No cervical lymphadenopathy.  CARDIOVASCULAR: S1 and S2 irregularly irregular. Positive for murmur at base of the heart rate, III/VI systolic.  LUNGS: Clear to auscultation without wheezing, rhonchi or rales.  ABDOMEN: Soft but distended, with healed multiple surgical scars. No rebound or guarding. Tympanic.  EXTREMITIES: With 2+ lower extremity edema to below the knee.  SKIN:  Positive for jaundice.  NEUROLOGIC: Cranial nerves II through XII appear to be grossly intact. Strength is 5 out of 5 all extremities. Sensation is intact to light touch.  PSYCHIATRIC: Awake, alert, oriented x 2 but cooperative.   LABORATORY AND DIAGNOSTIC DATA: Glucose 121, BUN 13, creatinine 0.98, sodium 143, potassium 3.3. Ammonia  level was 122. LFTs: Bilirubin is 3.5, which is about his baseline. Albumin is 2.3, total protein 5.7, otherwise LFTs within normal limits. WBC 4.8, hemoglobin 9.4, platelets 77. INR 1.6. PT is 19. EKG shows normal sinus rhythm, rate of 67, no acute ST elevations or depressions.   ASSESSMENT AND PLAN: We have a 59 year old male with history of cirrhosis who apparently has refused transplant in the past with multiple family members with cirrhosis, hypertension, noncompliance with medications and gastroenterology followup, who presents with subacute but progressive lethargy, sleepiness and elevated ammonia level. He is neurologically intact, but lethargic and drowsy-appearing. I would increase his lactulose to 30 mg every 6 hours, as his ammonia level is greater than 100 and significantly elevated than his baseline. We would recheck the ammonia level in the morning and I discussed compliance issues with his wife, who states she is a nurse, and advised taking the lactulose and holding it for the day if the patient has more than 3 loose bowel movements daily. His cirrhosis also has to be followed up with gastroenterology. He has seen [facility] in the past. Would consult gastroenterology. Start the patient on Lasix intravenously at low dose daily, as the patient has some ascites and lower extremity edema and does not appear to be on diuretics. Would hold the blood pressure medications and decrease the nadolol, as his blood pressure is on the lower side as well. Would replete the potassium. He does have anemia and thrombocytopenia, which likely is from his cirrhosis in addition to coagulopathy with INR of 1.6. His LFTs appear to be around baseline. Would clinically monitor him as well. Would also start him on frequent neuro checks.   CODE STATUS: The patient is full code.   TOTAL TIME SPENT: 60 minutes.     ____________________________ Krystal Eaton, MD sa:jm D: 04/30/2013 21:20:56 ET T: 04/30/2013  21:57:21 ET JOB#: [id number]  cc: Krystal Eaton, MD, <Dictator> Teena Irani. Terance Hart, MD Krystal Eaton MD ELECTRONICALLY SIGNED 05/11/2013 12:40

## 2015-03-31 NOTE — H&P (Signed)
PATIENT NAME:  Calvin [name], Calvin [name] MR#:  [id number] DATE OF BIRTH:  05/07/1956  DATE OF ADMISSION:  07/23/2013  PRIMARY CARE PHYSICIAN:  Dr. Dorothey Baseman.   REFERRING PHYSICIAN:  Dr. Sharyn Creamer.   CHIEF COMPLAINT:  Intractable vomiting.   HISTORY OF PRESENT ILLNESS:  Mr. Filsinger is a 59 year old Caucasian male with history of alcoholic liver cirrhosis admitted last month to this hospital on July 17th, discharged on July 21 after treatment of hepatic encephalopathy secondary to noncompliance with medications.  I understood that the patient was discharged home, then he was readmitted at [facility] for similar presentation and his ammonia level was elevated as well.  The patient now presenting at the Emergency Department here with complaint of repetitive vomiting.  His blood work-up here was unremarkable.  In fact, his findings are better than last admission here.  However, his symptoms were not controlled with intravenous Zofran, nor with IV Phenergan.  The patient received IV hydration and admitted as observation for further control of his symptoms and IV hydration.   REVIEW OF SYSTEMS:  The patient is uncooperative and does not give me any data and he refers me to the computer to check it.  Earlier, the interview by the Emergency Department physician and his physician assistant tells me that he was cooperative with them at that time.  He was also oriented and did not show much of encephalopathy.  I am not sure if he has an attitude regarding his answers or he is now entering a phase of hepatic encephalopathy or the Phenergan causes him to have some sedation and altered his attitude.  Therefore, a 10 point system review is unobtainable with him.   PAST MEDICAL HISTORY:  Liver cirrhosis.  History of alcohol abuse in the past, but he quit, that was 2007 when he quit, systemic hypertension, multiple admissions with hepatic encephalopathy, hyperlipidemia, anemia of chronic disease, thrombocytopenia glaucoma,  gastroesophageal reflux disease, diverticulosis, colon polyps, hemorrhoids, allergic rhinitis, type 2 diabetes mellitus, non-insulin-dependent, recurring falls, hypomagnesemia.   PAST SURGICAL HISTORY:  Cholecystectomy in 2009, partial colectomy secondary to diverticulitis in 2007, vocal cord polypectomy in 2007, sinus surgery in 2002 and 2005, hemorrhoid banding in 2002, tonsillectomy during childhood, left knee arthroscopy in 1991 and rotator cuff repair in 1996.   SOCIAL HABITS:  He quit smoking.  He used to smoke 1 pack a day for more than 30 years and he quit in 1990.  He quit alcoholism in 2007.  According to the records, he occasionally uses snuff and did about a can over a week period of time since 1995.  When I questioned the patient if he is still abusing tobacco he refuses to give me details and he just said yes and again referred me to the computer to check it.   SOCIAL HISTORY:  He is married, living with his wife.  He is not working.  He is unemployed.   FAMILY HISTORY:  The patient does not provide me any information, however according to the records, there is family history of liver cirrhosis and colorectal cancer.   ADMISSION MEDICATIONS:  Rifaximin 550 mg twice a day, Proventil inhaler 2 puffs 4 times a day as needed, multivitamin once a day, promethazine 25 mg q. 6 hours as needed, magnesium oxide 400 mg once a day, Lexapro 10 mg a day, lansoprazole or Prevacid 30 mg once a day, latanoprost ophthalmic drops 1 drop in each eye once a day, furosemide 20 mg once a day, lactulose 30 mL  4 times a day, Aldactone 25 mg a day, Advair Diskus 500 1 puff twice a day, fluticasone nasal spray two sprays twice a day and Allegra D 60 mg 1 tablet twice a day.   ALLERGIES:  No known drug allergies.   PHYSICAL EXAMINATION: VITAL SIGNS:  Blood pressure 102/54, respiratory rate 18, pulse 102, temperature 97.8, oxygen saturation 96%.  GENERAL APPEARANCE:  This is a middle-aged male lying in bed sleepy,  but arousable, in no acute distress.  HEAD AND NECK:  No pallor.  No icterus.  No cyanosis.  Ear examination revealed normal hearing, no discharge, no lesions.  Examination of the nose showed no ulcers, no discharge, no bleeding.  Oropharyngeal examination, visible part of the lips and tongue appears normal.  I could not adequately examine the mouth as he is not very cooperative.  Eye examination revealed normal eyelids and conjunctivae.  Pupils about 4 to 5 mm, round and equal.  I could not examine with the reactivity to light as he closes his eyes and does not cooperate with exam.  NECK:  Supple.  Trachea at midline.  No thyromegaly.  No cervical lymphadenopathy.  No masses.  HEART:  Revealed normal S1, S2.  No S3, S4.  No murmur.  No gallop.  No carotid bruits.  RESPIRATORY:  Normal breathing pattern without use of accessory muscles.  No rales.  No wheezing.  ABDOMEN:  Soft.  No tenderness.  No rebound.  No rigidity.  No hepatosplenomegaly.  No masses.  No hernias.  SKIN:  Revealed no ulcers.  No subcutaneous nodules.  MUSCULOSKELETAL:  No joint swelling.  No clubbing.  NEUROLOGIC:  Cranial nerves II through XII were intact.  No focal motor deficit.  PSYCHIATRIC:  The patient is sleepy, but arousable at the time of my examination.  He is uncooperative.  His mood appears to be a little depressed or may be because he is sleepy.   LABORATORY FINDINGS:  His EKG showed normal sinus rhythm at a rate of 100 per minute.  QS in leads V1, V2, otherwise unremarkable EKG.  His serum glucose 115, BUN 26, creatinine 0.9.  On August 4th his BUN was 13 with a creatinine of 0.7.  His serum sodium 132, potassium 3.5.  Lipase is 458.  His plasma ammonia is 69.  His serum albumin 3.1, bilirubin was elevated at 3.7.  His bilirubin in July and earlier of this month is around 5 or 5.1 up to 5.6.  AST 76, ALT 53, alkaline phosphatase 230.  Troponin less than 0.02.  CBC showed a white count of 8500, hemoglobin 12, hematocrit  34, platelet count 127.  Urinalysis was unremarkable.   IMPRESSION: 1.  Intractable vomiting.  2.  Elevated lipase, may or may not indicate that he has pancreatitis.  This could be elevated for other reasons.  3.  Liver cirrhosis.  4.  Recurrent admissions with hepatic encephalopathy.  5.  Systemic hypertension.  6.  Anemia of chronic disease.  7.  Thrombocytopenia.  8.  Noncompliance.   PLAN:  To admit for observation, IV hydration, hold the diuretic.  Zofran as needed for the nausea and vomiting.  Avoid Phenergan to avoid sedation.  I will hold most of his by mouth medications until his vomiting is controlled.  Nevertheless, I will continue the lactulose and the Protonix.  I will repeat lipase tomorrow.  For deep vein thrombosis prophylaxis, we will avoid anticoagulation due to his thrombocytopenia and liver disease.  Nevertheless, I will put  Ted stocking and compression.   Time spent in evaluating this patient took more than 50 minutes.     ____________________________ Carney CornersAmir M. Rudene Rearwish, MD amd:ea D: 07/24/2013 00:55:00 ET T: 07/24/2013 01:28:17 ET JOB#: [id number]  cc: Carney CornersAmir M. Rudene Rearwish, MD, <Dictator> Zollie ScaleAMIR M Lydon Vansickle MD ELECTRONICALLY SIGNED 07/24/2013 21:39

## 2015-03-31 NOTE — Discharge Summary (Signed)
PATIENT NAME:  Calvin [name], Calvin [name] MR#:  [id number] DATE OF BIRTH:  [birth year]  DATE OF ADMISSION:  03/04/2013 DATE OF DISCHARGE:  03/06/2013  BRIEF HISTORY AND HOSPITAL COURSE: The patient is a 59 year old gentleman with significant end-stage liver disease, who presents with a large symptomatic right inguinal hernia. He was evaluated by his primary care physician who felt that he was a reasonable candidate for surgical intervention.  Because of increasing symptoms, we offered surgical repair. After appropriate preoperative preparation and informed consent, he was taken to surgery on the morning of 03/04/2013 where he underwent a robotically-assisted right inguinal hernia repair. The procedure was uncomplicated. He had no significant intraoperative problems. Postoperatively, he had some mild lethargy likely related to his liver disease, which cleared over the first 24 hours. He was discharged home on the 29th to be followed in the office in 7 to 10 days' time for final followup.   DISCHARGE MEDICATIONS: Proventil 2 puffs q. 6 hours p.r.n., lansoprazole 30 mg once a day, nadolol 80 mg b.i.d., hydrochlorothiazide 12.5 mg b.i.d., lisinopril 20 mg b.i.d., Allegra-D 1 tablet every 12 hours p.r.n., Advair 500 mcg/50 mcg b.i.d., Klor-Con 20 mEq p.o. b.i.d. and saw palmetto 320 mg once a day.   DISCHARGE DIAGNOSES:  1. Right inguinal indirect hernia.  2. Liver insufficiency. 3. Mild hepatic encephalopathy.   SURGERY: Robotically-assisted right inguinal hernia repair.  ____________________________ Carmie Endalph L. Ely III, MD rle:cb D: 03/09/2013 20:38:06 ET T: 03/09/2013 23:45:40 ET JOB#: [id number]  cc: Carmie Endalph L. Ely III, MD, <Dictator> Teena Iraniavid M. Terance HartBronstein, MD Quentin OreALPH L ELY MD ELECTRONICALLY SIGNED 03/10/2013 20:31

## 2015-03-31 NOTE — Consult Note (Signed)
Pt with large pleural effusion.  Could be malignant, could be due to hydropneumothorax.  this is transudation of fluid from the peritoneal cavity into the pleural cavity via channels through the diaphragm given the lower pressure in the pleural cavity.  Need pulmonary consult and either them or radiology to do a tap of the fluid.  If it is due to hydropneumothorax a chest tube is contraindicated.  Best treatment for this condition would be transfer to medical center and do a TIPS procedure on the liver.  Electronic Signatures: Scot JunElliott, Aneliese Beaudry T (MD)  (Signed on 04-Nov-14 17:59)  Authored  Last Updated: 04-Nov-14 17:59 by Scot JunElliott, Audric Venn T (MD)

## 2015-04-01 NOTE — Consult Note (Signed)
PATIENT NAME:  [name], [name] MR#:  [id number] DATE OF BIRTH:  1956-10-03  DATE OF CONSULTATION:  12/13/2013  REFERRING PHYSICIAN:  Dr. Berlinda LastGutierrez Sanchez CONSULTING PHYSICIAN:  Sheppard Plumberimothy E. Treniece Holsclaw, MD  REASON FOR CONSULTATION: Management of recurrent right-sided pleural effusion.   I have personally seen and examined Calvin [name]. I have discussed his care in detail with Dr. Mordecai MaesSanchez. I have independently reviewed the patient's films. I have reviewed his chart.   HISTORY OF PRESENT ILLNESS: This is a 59 year old gentleman who was admitted to the hospital in November with recurrent pleural effusions. At that time he was evaluated for possible pleurodesis or a TIPS procedure. The patient was discharged to home and presented back to our Emergency Room in late December where he was admitted to the hospital on 2 separate occasions with recurrent right-sided pleural effusions. Both times the patient felt that he was symptomatic with increasing shortness of breath and pain in his left chest. His oxygen saturations were less than 90% and so he underwent ultrasound-guided thoracentesis. He has had a total of 4 thoracenteses performed within the last several months.   The patient has a extensive history of alcoholic cirrhosis and is [facility]. He also has a gastroenterologist here in town. Dr. Marva PandaSkulskie has been assisting with his care and has been managing his local hepatic issues. The patient was admitted to the hospital after undergoing a second thoracentesis, and I was asked to see him again for management of his recurrent pleural effusions.   PAST MEDICAL HISTORY: The patient has a significant history of alcoholic cirrhosis. He has a history of hypertension and hyperlipidemia as well as hepatic encephalopathy, diabetes mellitus, anemia of chronic disease, and thrombocytopenia. As previously mentioned, he has had multiple thoracenteses over the last several months.   PAST SURGICAL  HISTORY: The patient was has undergone a cholecystectomy, partial colectomy and vocal cord polyp removal. He has had a right inguinal hernia repair and a rotator cuff repair as well. He has a history of hemorrhoids, a tonsillectomy, and a left knee arthroscopy.   SOCIAL HISTORY: The patient denies any alcohol use. He states it has been several years since he drank and he does not smoke at the present time. He lives with his wife. He does have 9 dogs at home.   REVIEW OF SYSTEMS: Pertinent positives included his shortness of breath as outlined above. The only other positive finding was easy bruising which he attributes to local trauma from his dogs playing with him.   PHYSICAL EXAMINATION:  GENERAL: A thin gentleman in no acute distress. He was able to speak in complete sentences. He was not using nasal cannula oxygen.  HEENT: Scleral icterus.  NECK: Supple without thyromegaly or adenopathy. There were no palpable masses.  LUNGS: Diminished breath sounds on the right.  HEART: Regular. There were no murmurs.  ABDOMEN: Distended. He did have a large umbilical hernia which was easily reducible. There was no evidence of caput medusa.  EXTREMITIES: Without clubbing, cyanosis, or edema. He had bounding pulses. He did have some palmar erythema as well.   ASSESSMENT AND PLAN: I have independently reviewed the patient's films. He has multiple recurrent pleural effusions on the right. I had a long discussion with him regarding the possibility of performing a Pleurx catheter or talc pleurodesis. At the present time, I do not feel that either were indicated given his severe underlying liver disease. 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 The  only other option would be to put in a peritoneal dialysis catheter and drain his abdomen dry and then perform a thoracoscopy and talc pleurodesis. This would require ongoing drainage of his  intra-abdominal cavity to keep the pleural space dry during the pleurodesis.   Thank you very much for allowing me to participate in his care today.    ____________________________ Sheppard Plumber. Thelma Barge, MD teo:np D: 12/13/2013 15:06:04 ET T: 12/13/2013 15:28:33 ET JOB#: [id number]  cc: Marcial Pacas E. Thelma Barge, MD, <Dictator> Jasmine December MD ELECTRONICALLY SIGNED 12/14/2013 14:41

## 2015-04-01 NOTE — Discharge Summary (Signed)
PATIENT NAME:  Calvin [name], [name] MR#:  [id number] DATE OF BIRTH:  [birth year]  DATE OF ADMISSION:  12/02/2013 DATE OF DISCHARGE:  12/04/2013  DISCHARGE DIAGNOSES:  1.  Recurrent pleural effusion and respiratory failure due to it.  2.  Liver cirrhosis as the cause of pleural effusion.  3.  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  4.  Electrolyte imbalance.  5.  Renal failure, acute, correct in hospital.   CODE STATUS: FULL CODE.   MEDICATIONS ON DISCHARGE:  1.  Lansoprazole 30 mg oral delayed-release capsule once a day.  2.  Allegra 12 Hours 60 mg one tablet every 12 hours.  3.  Latanoprost ophthalmic drops 1 drop each eye at bedtime.  4.  Fluticasone nasal 2 sprays each nostril 2 times a day.  5.  Proventil 2 puffs inhaled every 6 hours as needed for shortness of breath.  6.  Lactulose 10 g/15 mL oral  30 mL four times a day.  7.  Rifaximin 550 mg oral tablet 2 times a day.  8.  Ondansetron 4 mg oral tablet 4 times a day as needed for nausea and vomiting.  9.  Lexapro 10 mg oral tablet once a day.  10.  Advair 2 puffs inhalation 2 times a day.  11.  Promethazine 25 mg oral tablet every 6 hours as needed for nausea and vomiting.  12.  Oxycodone 5 mg oral tablet every 6 hours as needed for pain.  13.  Ciprofloxacin 750 mg oral every 7 days  14.  Spironolactone 100 mg oral tablet once a day.  15.  Furosemide 80 mg oral tablet once a day.  16.  Potassium chloride 20 mEq one packet 2 times a day.  17.  Sucralfate 1 g oral tablet 3 times a day.   HOME HEALTH ON DISCHARGE: Yes. Advised to have physical therapy, nurse, home health instruction. Advised to assess for overall medical condition.   ACTIVITY: As tolerated.   TIMEFRAME TO FOLLOWUP: Within 1 to 2 weeks. Advised to check kidney function with PMD in 1 week for PT and INR, and scheduled an appointment with ultrasound department as outpatient for pleural effusion, drainage, and [facility].   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 The patient had increased right-sided pain and short of breath. Chest x-ray was done which showed whiteout of his right lung. About 2 weeks ago, he had shortness of breath and had pleural effusion, and thoracentesis was done. Was taking Lasix which was recently increased to 80 mg daily and discharged home with that.   HOSPITAL COURSE AND STAY:  1.  His ultrasound-guided thoracentesis was done the next day by the radiologist, but the patient started having some pain and discomfort, so that terminated and could not remove all fluid but they still removed 2 liters of fluid. The plan was by radiologist to do the thoracentesis in the next 3 days in hospital because of having long holiday weekend, but then on further discussion and help of case manager and radiology department, we scheduled his appointment as outpatient to come after 4 days and get his pleural tap done. He understands and agreed, and we discharged him with these instructions.  2.  Ascites due to cirrhosis. He was on diuretic therapy as per Dr. Shelle Ironein, GI. The patient was a candidate  for TIPS and we told the patient to follow continue following with [facility] hematology clinic.  3.  Severe hypokalemia and hypomagnesemia. We replaced it IV and p.o. and checked it for correction.  4.  Chronic kidney disease, stage III, which was stable and improved renal function.  5.  Hyponatremia due to fluid overload. There was some correction in the hospital.  6.  Thrombocytopenia due to liver cirrhosis, which was stable in the hospital.   CONSULTATIONS IN THE HOSPITAL: GI consult with Dr. Dow Adolph.   IMPORTANT LABORATORY RESULTS: On presentation, creatinine was 1.32 and sodium was 126. Potassium was 2.9 and magnesium was 1.0.  After the correction, on discharge creatinine was 1.1, sodium was 127, potassium was 3.8. Total white cell count was 12.9 which went up and came down to 12.5. Hemoglobin remained stable at 10.3. INR was 1.9.   IMPORTANT DIAGNOSTIC STUDIES: Chest x-ray, portable, showed recurrent large right pleural effusion, complete opacification of right hemithorax, and on the 26th of December, reduction in right-sided pleural effusion after the procedure. On the 27th of December, persistent sizable right effusion. Left lung clear. No pneumothorax.   TOTAL TIME SPENT ON THIS DISCHARGE: 40 minutes.   ____________________________ Calvin [name] [name], MD vgv:np D: 12/08/2013 17:34:00 ET T: 12/08/2013 20:14:16 ET JOB#: [id number]  cc: Calvin [name]. [name], MD, <Dictator> Altamese Dilling MD ELECTRONICALLY SIGNED 12/12/2013 22:00

## 2015-04-01 NOTE — Discharge Summary (Signed)
PATIENT NAME:  Calvin [name], Calvin [name] MR#:  [id number] DATE OF BIRTH:  [birth year]  DATE OF ADMISSION:  01/10/2014 DATE OF DISCHARGE:  01/11/2014  REASON FOR ADMISSION:  Abdominal pain, worsening hyponatremia.   DISCHARGE DIAGNOSES: 1.  Abdominal pain, likely secondary to indigestion.  2.  History of hepatic encephalopathy without exacerbation.  3.  Hyponatremia.  4.  Gastroesophageal reflux disease.  5.  Cirrhosis of the liver.  6.  Thrush.  7.  Recurrent pleural effusion, right side, secondary to hepatic hydrothorax.  8.  Alcohol abuse, now quit.  9.  Hypertension.  10.  Hyperlipidemia.  11.  History of diabetes.  12.  Acute on chronic anemia.  13.  Thrombocytopenia.   DISPOSITION:  Home.   MEDICATIONS:  Advair 2 puffs twice daily, Allegra-D daily, fluticasone 50 mcg two sprays b.i.d., Lasix 80 mg daily, lactulose 30 mg 4 times a day, lansoprazole 30 mg daily, Lexapro 10 mg daily, Zofran 4 mg as needed for nausea, oxycodone 5 mg q. 6 hours, potassium chloride 20 mEq twice daily, promethazine 25 mg every 6 hours, Proventil 2 puffs every 6 hours as needed, rifaximin 550 mg twice daily, spironolactone 100 mg take 1-1/2 tablets once a day, sucralfate 1 gram 3 times a day, Theragran once a day.   HOSPITAL COURSE:  This is a nice 59 year old gentleman who I have met on previous hospitalizations who came with a history of recurrent pleural effusions due to ascites and liver cirrhosis, for what he has had multiple thoracentesis in the past.  He returned back complaining of pain and worsening distention.  The patient had apparently been a little confused earlier, but now at admission was back to his baseline.  Denied any other problems.  On his evaluation the patient had tachycardia 123, although the patient has chronic tachycardia.  His temperature was 98.1, his respirations were 22, his blood pressure was normal.  He had a slightly elevated BUN of 22 and a creatinine of 1.24 with a potassium of 4.9 and a  sodium of 122.  The patient is admitted for evaluation of these problems.  The patient for his abdominal pain, it was thought to be related to ascites and possible to spontaneous bacterial peritonitis for what the patient was started on IV Cipro.  It was planned to have a paracentesis, but there was not enough fluid to drain.  The patient had no fevers.  His white count was slightly elevated, although on further conversations with the patient, it seems like the patient had ate a whole pecan pie that afternoon and started to feel sick after that so what we think the reason of his abdominal pain was secondary to some indigestion.  The patient was asymptomatic by the time I reevaluated him.  He wanted to go home.  We discharged him with the same medications, with followup on GI [facility], Dr. Patsy Baltimore and if possible follow up with GI here in [place].  The patient is not going to continue any antibiotics.  The chances of having an infection are pretty low.    I spent about 45 minutes with this discharge.    ____________________________ Mapleton Sink, MD rsg:ea D: 01/15/2014 00:45:47 ET T: 01/15/2014 04:39:13 ET JOB#: [id number]  cc: Liberty Sink, MD, <Dictator> Cornelia Walraven America Brown MD ELECTRONICALLY SIGNED 01/23/2014 23:16

## 2015-04-01 NOTE — Consult Note (Signed)
PATIENT NAME:  Calvin [name], Calvin [name] MR#:  [id number] DATE OF BIRTH:  [birth year]  DATE OF CONSULTATION:  12/02/2013  REFERRING PHYSICIAN:  Dr. Benjie Karvonen. CONSULTING PHYSICIAN:  Arther Dames, MD  REASON FOR THE CONSULT:  Right hepatic hydrothorax.   HISTORY OF PRESENT ILLNESS:  Mr. Calvin [name] is a 59 year old male with a history of decompensated cirrhosis, likely secondary to former heavy alcohol use, who presented to the hospital for evaluation of shortness of breath. During this evaluation, he was noted to have a very large pleural effusion on the right side. Of note, he has also had this pleural effusion in the past, and had a thoracentesis performed. Unfortunately, it appears as though this pleural effusion has recurred.   Other than having a pleural effusion and shortness of breath, he otherwise is doing pretty well in terms of his liver disease. He describes some mild distention in his abdomen, but this is better than when he previously had significant ascites. He is not having any trouble with confusion that he is aware of. Also, no rectal bleeding or melena.     He does report that he is followed for his liver disease both at [facility] and by Dr. Gustavo Lah in the [place] clinic. Apparently, there has been some discussion of transplant at his [facility] clinic.   PAST MEDICAL HISTORY:  1.  Cirrhosis.  2.  Hypertension.  3.  Hyperlipidemia.  4.  Anemia of chronic disease.  5.  GERD.  6.  Glaucoma.  7.  DM-2.   SOCIAL HISTORY:  He reports that he was a previous drinker, but he quit about 10 years ago. He does not think his drinking was actually that heavy. Denies smoking. No IV drug use.   FAMILY HISTORY:  He denies a family history of cirrhosis of the liver or GI malignancy.   HOME MEDICATIONS:  1.  Spironolactone 100 mg daily.  2.  Lasix 80 mg daily.  3.  Advair 250/50 b.i.d.  4.  Lisinopril 10 mg daily.  5.  Lactulose 30 mg 4 times a day.  6.  Rifaximin 550 mg twice a day.  7.  Lansoprazole 30 mg  daily.  8.  Multivitamin daily.  9.  Lexapro 10 mg daily.  10.  Ciprofloxacin 750 mg every 7 days.   REVIEW OF SYSTEMS:   CONSTITUTIONAL: No weight gain or weight loss.  No fever or chills. HEENT: No oral lesions or sore throat. No vision changes. GASTROINTESTINAL: See HPI.  HEME/LYMPH: No easy bruising or bleeding. CARDIOVASCULAR: No chest pain or dyspnea on exertion. GENITOURINARY: No hematuria. INTEGUMENTARY: No rashes or pruritus PSYCHIATRIC: No depression/anxiety.  ENDOCRINE: No heat/cold intolerance, no hair loss or skin changes. ALLERGIC/IMMUNOLOGIC: Negative for hives. RESPIRATORY: Positive for shortness of breath with no cough or wheezing. MUSCULOSKELETAL: No joint swelling or muscle pain.  PHYSICAL EXAMINATION:  VITAL SIGNS: Temperature is 98.1, pulse is 91, respirations are 18, blood pressure 121/80, pulse ox is 92% on 2 L. GENERAL: Alert and oriented times 4.  No acute distress. Appears stated age. HEENT: Normocephalic/atraumatic. Extraocular movements are intact. Anicteric. NECK: Soft, supple. JVP appears normal. No adenopathy. CHEST: Decreased breath sounds throughout the right lung field.  HEART: Regular. No murmur, rub, or gallop.  Normal S1 and S2. ABDOMEN: Mild distention throughout the abdomen, but normoactive bowel sounds and no rebound or guarding.   EXTREMITIES: No swelling whatsoever, well perfused. SKIN: No rash or lesion. Skin color, texture, turgor normal. NEUROLOGICAL: Grossly intact. PSYCHIATRIC: Normal tone and affect. MUSCULOSKELETAL:  No joint swelling or erythema.   LABORATORY DATA:  Sodium is 126, potassium is 3.6, BUN 16, creatinine 1.21, chloride 33. Lipase is slightly elevated. Ammonia normal. Liver enzymes: Albumin 2.3, total bili 3.6, direct bili 2, alk phos 162, AST 51, ALT 39. White count is 12.9, hemoglobin 12, hematocrit 35, platelets are 127. INR is 1.9   ASSESSMENT AND PLAN:  1.  Right pleural effusion: This is likely a hepatic  hydrothorax. Unfortunately, these can be very difficult to treat. For now, it would be reasonable to perform a thoracentesis for symptomatic relief. However, it is important not to leave the catheter in place and to remove it immediately after the procedure to prevent massive volume and protein loss. The most likely need in this case will be for a TIPS. This will need to either be done at [facility] or [place], likely [facility] given that is where he has a hepatologist. It would be reasonable to perform a thoracentesis and see if it recurs or not. If it does recur quickly, he will need to be transferred for a TIPS procedure.   In addition, we can continue some diuretics. However, his sodium is already low at 126. We will need to be very careful with diuretics and will need to monitor sodium, potassium, renal function very closely in the setting of diuresis. I suspect that diuresis itself would not be sufficient.   2.  Cirrhosis: It does not seem as though he is having other complications of his cirrhosis at this time. It would be reasonable to continue the Cipro once a week for SBP prophylaxis. In addition, he also tells me that he had a recent upper endoscopy at [facility], so this would not be indicated at this time. In terms of his hepatic encephalopathy, he can continue both on the lactulose titrating 2 or 3 bowel movements a day, along with the rifaximin 550 b.i.d.   Thank you for this consult.   ____________________________ Arther Dames, MD mr:ms D: 12/02/2013 20:28:56 ET T: 12/02/2013 20:50:59 ET JOB#: [id number]  cc: Arther Dames, MD, <Dictator> Mellody Life MD ELECTRONICALLY SIGNED 12/12/2013 16:58

## 2015-04-01 NOTE — Discharge Summary (Signed)
PATIENT NAME:  Calvin [name], Calvin [name] MR#:  [id number] DATE OF BIRTH:  1955-12-10  DATE OF ADMISSION:  12/11/2013 DATE OF DISCHARGE:  12/16/2013  PRIMARY CARE PHYSICIAN:  Dr. Dorothey Baseman.   PRIMARY HEPATOLOGIST: Dr. Jacqualine Mau, [facility] GI/hepatology.   Follow up with Dr. Jacqualine Mau tomorrow at noon at [facility] hepatology.   CHIEF COMPLAINT: On admission, shortness of breath.    FINAL DIAGNOSES: 1.  Recurrent right side large pleural effusion secondary to liver disease.  2.  Transudate.  3.  Ascites.  4.  Cirrhosis.  5.  Severe hypokalemia.  6.  Severe hypomagnesemia.  7.  Chronic kidney disease stage III.  8.  Hyponatremia.  9.  Hypoalbuminemia. 10.  Thrombocytopenia due to liver cirrhosis.    DISPOSITION: Home. Offered the patient home health, but the patient says that he already has home health. Offered physical therapy, but he is getting it through home health.   The patient is weak, chronically debilitated. It has been like that for over a year without any significant progression or change.   MEDICATIONS AT DISCHARGE:  Lansoprazole 30 mg once a day, Allegra-D 12 hour extended relief twice daily, fluticasone nasal spray 50 mcg 2 sprays each nostril 2 times daily, Proventil HFA for shortness of breath, lactulose 20 grams orally 4 times a day, Theragran  therapeutic multivitamins once a day, Zofran 4 mg 4 times a day as needed for nausea, Lexapro 10 mg once a day, Advair HFA 230/21, 2 puffs twice daily, promethazine 25 mg every 6 hours as needed for nausea, vomiting; spironolactone 150 mg once a day, furosemide 80 mg once a day, potassium chloride 20 mEq twice daily, sucralfate 1 gram 3 times a day before meals, oxycodone 5 mg every 6 hours as needed for pain, rifaximin 550 mg twice daily.   HOSPITAL COURSE:  The patient is a very nice 59 year old gentleman with history of end-stage liver disease, cirrhosis. The patient is also seen by hepatology at GI  Department with [facility] by Dr. Jacqualine Mau. Apparently, the  patient has been discharged on a previous occasion with a plan to get him into [facility] and have DIPS procedure done, although the patient has not been able to meet criteria for it, as his liver function continues to raise with a bilirubin above 2, which puts him at risk of SBP, decompensation and hepatorenal syndrome.   The patient now was admitted at this time after being discharged from the hospital. He had a pleural effusion for which he had a thoracentesis on December 27, and comes back on January 3 with a significant increase in the volume on his chest with significant shortness of breath and work of breathing.   His oxygen saturation was 90% on 2 liters. He was tachycardic up 104, and was admitted for control of this.  He underwent a thoracentesis. Lasix was given, and the patient started feeling great right after.   I had a long discussion with Dr. Thelma Barge, who offered the possibility of doing a Pleurx catheter or talc pleurodesis.      As far as the pleurodesis, it is very rare that it could be successful due to the amount of fluid he is pulling out of his belly so quickly.  The patient was interested in getting this procedure done, but after speaking with Dr. Jacqualine Mau, he did not recommend it.   Dr. Jacqualine Mau states that the plan of care should be based on removal of fluid with thoracentesis and Lasix with spironolactone. Since the patient has  decrease in sodium and occasional decrease in potassium, it is not recommended to push it any further, especially since he is getting dizzy and lightheaded whenever he has dialysis.   Apparently, the patient already has an appointment to see Dr. Jacqualine MauZacks. On my conversation with Dr. Jacqualine MauZacks, he said that he could see him this Friday for which he is going to be discharged today to go to his office in the morning, around noon.   The patient has to discuss the possibility of DIP procedure in the future, but for now, he is going to need to get his liver a little bit better.  The patient is not quite on the transplant list, as he has not completed most of the required documents.   The patient has history of COPD and he was taking ciprofloxacin for that. He finished his course right now. He has history of SIRS with leukocytosis and increased white blood cells, which has improved and resolved.   The patient has hyponatremia, which is secondary to volume overload. Other than that, the patient was offered to just get some treatment over here with replacement of albumin with IV albumin every 8 hours and increase his diuresis with Lasix of 20 mg every 8 hours.   The patient had good results with that, but his edema has just slightly improved.  He got 6 doses replaced of albumin followed by Lasix, and overall he tolerated it very well.  Right now, we are going to discharge him with a prescription of Lasix, and he needs to take daily 80 mg and avoid drinking excessive amount of fluids.   I spent about 45 minutes with this discharge.    ____________________________ Felipa Furnaceoberto Sanchez Gutierrez, MD rsg:dmm D: 12/16/2013 13:06:00 ET T: 12/16/2013 19:09:36 ET JOB#: [id number]  cc: Teena Iraniavid M. Terance HartBronstein, MD Brooke DareSteven Zacks, MD Felipa Furnaceoberto Sanchez Gutierrez, MD, <Dictator> Herschell Dimesichard J. Renae GlossWieting, MD   [facility] Dwayna Kentner Juanda ChanceSANCHEZ GUTIERRE MD ELECTRONICALLY SIGNED 01/02/2014 8:13

## 2015-04-01 NOTE — H&P (Signed)
PATIENT NAME:  Calvin [name], [name] MR#:  [id number] DATE OF BIRTH:  27-Feb-1956  DATE OF ADMISSION:  12/11/2013  PRIMARY DOCTOR:  Dr. Dorothey Basemanavid Bronstein  EMERGENCY ROOM PHYSICIAN:  Dr. Margarita GrizzleWoodruff  CHIEF COMPLAINT: Shortness of breath.   HISTORY OF PRESENT ILLNESS: A 59 year old male with history of pleural effusion and recurrent thoracocentesis, came in because of chest pain and also shortness of breath. The patient had a thoracocentesis on December 26. At that time, 2.5 liters of fluid removed, and discharged home on December 27. The patient comes in because of the same trouble, with trouble breathing. O2 sats were 89% on room air and tachycardic, 116 beats per minute. The patient has no oxygen at home. Because of shortness of breath and hypoxia, [facility] about 3 liters of fluid under sonographic guidance. The patient right now feels slightly better, but still hypoxic with 90% on 2 liters.  Tachycardia resolved, but heart rate is still up at 104, so I am going to admit him for recurrent pleural effusion. Probably he needs a pleurodesis at this time. The patient was here in August and also in November, and then December as well.   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 The patient has a pleural effusion related to cirrhosis and right-sided hepatic hydrothorax. History of alcohol abuse, now quit. Hypertension. Hyperlipidemia. Chronic ammonia elevation with history of hepatic encephalopathy. History of diabetes mellitus. Anemia of chronic disease. Thrombocytopenia.   SURGICAL HISTORY: Significant for cholecystectomy, partial colectomy, vocal cord polyp removal, hemorrhoid banding, tonsillectomy, left knee arthroplasty, rotator cuff repair, sinus surgery.   FAMILY HISTORY:  Significant for liver cirrhosis and colorectal cancer.   SOCIAL HISTORY: No smoking. No drinking. The patient has no drugs. Lives with wife. Has 9 dogs at home  and cows as well.   REVIEW OF SYSTEMS:  CONSTITUTIONAL: Has no fever, no chills. Does have some fatigue.  EYES: No blurred vision, no double vision. Has history of glaucoma.  ENT: No tinnitus. No ear pain. No epistaxis.  RESPIRATIONS: Has shortness of breath starting today morning. The patient has no palpitations. Does have chest pain due to shortness of breath.  CARDIOVASCULAR: Has chest pain due to shortness of breath and pleural effusion.  GASTROINTESTINAL: Has no nausea. No vomiting. Does have ascites due to cirrhosis.  GENITOURINARY: No dysuria.  HEMATOLOGIC: No anemia or easy bruising.  INTEGUMENT: No skin rashes.  MUSCULOSKELETAL: The patient has no gout.  NEUROLOGIC: No CVA or TIA.   PSYCHIATRIC: No anxiety or insomnia.   ALLERGIES:  No known allergies.  MEDICATIONS:  1.  Advair Diskus 2 puffs b.i.d.  2.  Allegra.  3.  Cipro 750 daily, that was supposed to be finished on January 8. 4.  Lasix 80 mg 1 tablet daily.  5.  Lactulose 30 mL 4 times a day.  6.  Lansoprazole 30 mg daily.  7.  Lexapro 10 mg p.o. daily.  8.  Oxycodone 5 mg every 6 hours as needed for pain.  9.  Kay Ciel 20 mEq p.o. b.i.d.   10.  Rifaximin 550 mg p.o. b.i.d.  11.  Aldactone 100 mg 1-1/2 tablets daily.  12.  Sucralfate 1 gram p.o. t.i.d.   PHYSICAL EXAMINATION: VITAL SIGNS: Temperature is 98.4, heart rate 116 initially, but during my visit was 104, blood pressure 118/75, O2 sats 92% on 2 liters.  GENERAL: He is alert, awake, oriented, very cachectic male, not in distress, answering questions appropriately.  HEENT: Head atraumatic, normocephalic. Pupils equally reacting to light. Extraocular movements are intact.  EARS, NOSE, THROAT: The patient has no tympanic membrane congestion. No turbinate hypertrophy. No oropharyngeal erythema.  NECK:  Supple. No JVD. No carotid bruit. No thyroid enlargement.  CARDIOVASCULAR: Tachycardic. S1, S2. Regular. No murmurs. PMI is not displaced.  LUNGS:  The patient  has decreased air entry on both sides, but much more on the right side, with dullness to percussion. No crackles. No rales.  ABDOMEN: The patient has ascites. Bowel sounds present.  EXTREMITIES: The patient has no edema, no cyanosis.  NEUROLOGIC:  Cranial nerves II to XII intact. Power 5/5. No focal deficits.  MUSCULOSKELETAL: Able to move all the extremities. No pathology of digits or nails.  SKIN: No skin rashes.   LABORATORY DATA: The patient's ultrasound-guided thoracocentesis removed 3.3 liters of fluid. Chest x-ray after thoracocentesis:  Interval reduction in persistent small to moderate size pleural effusion. No pneumothorax. The patient's glucose is 173  on chem 7 WBC 12.5, hemoglobin 10.3, hematocrit 30.1, platelets 104. Electrolytes: Sodium 127, potassium 3.8, chloride 92, bicarb 29, BUN 16, creatinine 1, . The patient's chest x-ray before the drainage showed persistent right pleural effusion. The left lung is clear. EKG showed undetermined rhythm, 115 beats.   ASSESSMENT AND PLAN: 1.  The patient is a 59 year old male with recurrent pleural effusion. The patient is admitted on December 25 and had a thoracocentesis on December 26, with 2.5 liters removed. Now comes in with the same problem. Had 3.3 liters removed. Regarding recurrent pleural effusion secondary to ascites and cirrhosis, patient probably needs a cardiothoracic surgery consult for possible pleurodesis. Continue the Lasix and Aldactone, and continue sodium restricted diet. The patient was seen by GI before, and is referred for TIPS. The patient to follow up with them as an outpatient.  2.  Hyponatremia due to cirrhosis and pleural effusion and chronic hyponatremia. Last time, sodium was 127. Continue the Lasix and fluid restriction. 3.  Leukocytosis. The patient was discharged on Cipro during last discharge. Continue Cipro.  4.  History of ascites, cirrhosis, hepatic encephalopathy. Ammonia is elevated at this time, but patient's  mental status is alert and oriented. Continue his lactulose and rifaximin.  5.  History of chronic obstructive pulmonary disease. Continue Advair, fluticasone, and Allegra.   6.  Gastroesophageal reflux disease. Continue PPI and sucralfate. The patient has an appointment with [facility].  TIME SPENT:  About 55 to 60 minutes on this case.     ____________________________ Katha Hamming, MD sk:mr D: 12/11/2013 18:25:00 ET T: 12/11/2013 19:07:23 ET JOB#: [id number]  cc: Katha Hamming, MD, <Dictator> Katha Hamming MD ELECTRONICALLY SIGNED 01/17/2014 13:58

## 2015-04-01 NOTE — H&P (Signed)
PATIENT NAME:  Calvin [name], Calvin [name] MR#:  [id number] DATE OF BIRTH:  [birth year]  DATE OF ADMISSION:  01/10/2014  PRIMARY CARE PROVIDER: Youlanda Roys. Lovie Macadamia, MD  ED REFERRING PHYSICIAN: Ahmed Prima, MD  CHIEF COMPLAINT: Abdominal pain, worsening hyponatremia.   HISTORY OF PRESENT ILLNESS: The patient is a 59 year old white male with history of recurrent pleural effusions with ascites, has liver cirrhosis. He has had multiple thoracenteses for this. Last hospitalized here on December 11, 2013, who returned back to the hospital with complaint of having abdominal pain and worsening distention of his abdomen. The patient also apparently was a little confused earlier but now is better. He has not had any chest pains or shortness of breath. He denies any nausea, vomiting or diarrhea. Denies any urinary frequency, urgency or hesitancy.   PAST MEDICAL HISTORY: Significant for:  1.  Liver cirrhosis secondary to alcohol abuse.  2.  Recurrent pleural effusion related to cirrhosis and right-sided hepatic hydrothorax.  3.  History of alcohol abuse, now has quit.  4.  Hypertension.  5.  Hyperlipidemia.  6.  History of hepatic encephalopathy.  7.  History of diabetes.  8.  Anemia of chronic disease.  9.  Thrombocytopenia.   PAST SURGICAL HISTORY: Significant for status post cholecystectomy, partial colectomy, vocal cord polyp removal, hemorrhoidal banding, tonsillectomy, left knee arthroplasty, rotator cuff repair, sinus surgery.   ALLERGIES: None.   FAMILY HISTORY: Significant for liver cirrhosis, colorectal cancer.   MEDICATIONS:  Advair 230/21 mcg 2 puffs b.i.d., Allegra-D 60/120 one tabs p.o. daily,  fluticasone 50 mcg 2 sprays to each nostril b.i.d., Lasix 80 daily, lactulose 30 mL 4 times a day, lansoprazole 30 one tab p.o. daily, Lexapro 10 daily, Zofran 4 mg 1 tab 4 times a day as needed, oxycodone 5 mg q.6 p.r.n. for pain, potassium chloride 20 mEq 1 packet p.o. b.i.d., promethazine 25 q.6 p.r.n.  for nausea and vomiting, Proventil 2 puffs q.6 hour p.r.n. for shortness of breath, rifaximin 550 one tab p.o. b.i.d., spironolactone 100, 1-1/2 tabs daily; sucralfate 1 gram t.i.d., Theragran multivitamins daily.   SOCIAL HISTORY: No smoking. No drinking. Lives with his wife.   REVIEW OF SYSTEMS: CONSTITUTIONAL: No fevers. No chills. Complains of weakness.  EYES: No blurred vision. No double vision. Has a history of glaucoma.  ENT: No tinnitus. No ear pain. No epistaxis.  RESPIRATORY: He has some intermittent shortness of breath. No palpitations. Likely has COPD.   CARDIOVASCULAR: Denies any chest pains, palpitations, syncope.  GASTROINTESTINAL: Complains of abdominal pain. No nausea, no vomiting. Has liver cirrhosis.  GENITOURINARY: Denies any dysuria, frequency, urgency.  HEMATOLOGIC: No anemia or easy bruisability.  SKIN: Complains of being jaundiced.  MUSCULOSKELETAL: No gout. No joint pain.  NEUROLOGIC: No CVA, TIA or seizures.  PSYCHIATRIC: No anxiety or insomnia.   PHYSICAL EXAMINATION: VITAL SIGNS: Temperature 98.1, pulse 123, respirations 22, blood pressure 130/80, O2 96% on room air.  GENERAL: The patient is a chronically ill-appearing male, jaundiced.   HEENT: Head atraumatic, normocephalic. Pupils equally round, reactive to light and accommodation. There is no conjunctival pallor. No scleral icterus. Nasal exam shows no drainage or ulceration. Oropharynx is clear without any exudate.  NECK: Supple without any JVD.  CARDIOVASCULAR: Regular rate and rhythm. Tachycardic. No murmurs, rubs, clicks or gallops. PMI is not displaced.  ABDOMEN: Soft, nontender. There is mild distension of the abdomen and some epigastric tenderness.  No guarding or rebound. Positive bowel sounds x 4.  EXTREMITIES: No clubbing, cyanosis or edema.  SKIN: He does have jaundice. No rash.   EXTREMITIES: No clubbing, cyanosis or edema.  LYMPHATICS: No lymph nodes palpable.  VASCULAR: Good DP, PT pulses.   PSYCHIATRIC: Awake, alert, oriented x 3. No focal deficits.  NEUROLOGIC: Cranial nerves II through XII grossly intact. No focal deficits.   LABORATORY DATA: Glucose 121, BUN 22, creatinine 1.24, sodium 122, potassium 4.9, chloride 87, CO2 is 29. Lipase 440. LFTs: Total protein 6.5, albumin 2.5, bili total 5.7, alk phos 171, AST 42, ALT 30. WBC 10.7, hemoglobin 11.6, platelet count 139.   ASSESSMENT AND PLAN: The patient is a 59 year old white male with recurrent admissions for ascites and pleural effusion, liver cirrhosis-related, who presents with abdominal pain, noted to have some sinus tachycardia, worsening hyponatremia.  1.  Abdominal pain, possibly related to ascites. The patient had no fever. WBC count is minimally elevated. At this time, we will treat him for possible spontaneous bacterial peritonitis, give him IV Cipro. Will ask paracentesis to be done tomorrow morning.  2.  History of hepatic encephalopathy. Continue lactulose and rifaximin.  3.  Hyponatremia, slightly worse, has progressively gotten a little worse over the past few months. He has been given 1 liter of normal saline. At this time, we will follow sodium. Hold Lasix and spironolactone today. I will not continue more fluids.  4.  Gastroesophageal reflux disease. We will continue proton pump inhibitors and sucralfate.  5.  Liver cirrhosis due to alcohol abuse. Bilirubin has been fluctuating but slightly worse. Prognosis poor.  6.  Oral thrush. Start him on nystatin swish-and-swallow.  7.  Miscellaneous. Will do lower extremity compression stockings for deep vein thrombosis prophylaxis.   NOTE: Time spent 50 minutes.     ____________________________ Lafonda Mosses Posey Pronto, MD shp:cs D: 01/10/2014 17:40:24 ET T: 01/10/2014 18:50:56 ET JOB#: [id number]  cc: Loyd Salvador H. Posey Pronto, MD, <Dictator> Alric Seton MD ELECTRONICALLY SIGNED 01/11/2014 14:27
